# Patient Record
Sex: Female | Born: 1995 | Race: White | Hispanic: No | Marital: Single | Smoking: Never smoker
Health system: Southern US, Community
[De-identification: ages and names within clinical notes are randomized; demographics above are authoritative.]

## PROBLEM LIST (undated history)

## (undated) ENCOUNTER — Inpatient Hospital Stay (HOSPITAL_COMMUNITY): Payer: Self-pay

## (undated) DIAGNOSIS — N12 Tubulo-interstitial nephritis, not specified as acute or chronic: Secondary | ICD-10-CM

## (undated) DIAGNOSIS — N200 Calculus of kidney: Secondary | ICD-10-CM

## (undated) DIAGNOSIS — N390 Urinary tract infection, site not specified: Secondary | ICD-10-CM

## (undated) HISTORY — PX: NO PAST SURGERIES: SHX2092

---
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
## 2013-02-26 ENCOUNTER — Encounter (HOSPITAL_COMMUNITY): Payer: Self-pay | Admitting: *Deleted

## 2013-02-26 ENCOUNTER — Inpatient Hospital Stay (HOSPITAL_COMMUNITY)
Admission: AD | Admit: 2013-02-26 | Discharge: 2013-02-26 | Disposition: A | Discharge status: Home / Self Care | Payer: Medicaid Other | Source: Ambulatory Visit | Attending: Obstetrics & Gynecology | Admitting: Obstetrics & Gynecology

## 2013-02-26 DIAGNOSIS — O26893 Other specified pregnancy related conditions, third trimester: Secondary | ICD-10-CM

## 2013-02-26 DIAGNOSIS — R300 Dysuria: Secondary | ICD-10-CM | POA: Insufficient documentation

## 2013-02-26 DIAGNOSIS — N949 Unspecified condition associated with female genital organs and menstrual cycle: Secondary | ICD-10-CM | POA: Insufficient documentation

## 2013-02-26 DIAGNOSIS — N898 Other specified noninflammatory disorders of vagina: Secondary | ICD-10-CM

## 2013-02-26 DIAGNOSIS — O99891 Other specified diseases and conditions complicating pregnancy: Secondary | ICD-10-CM | POA: Insufficient documentation

## 2013-02-26 DIAGNOSIS — O26899 Other specified pregnancy related conditions, unspecified trimester: Secondary | ICD-10-CM

## 2013-02-26 HISTORY — DX: Calculus of kidney: N20.0

## 2013-02-26 HISTORY — DX: Tubulo-interstitial nephritis, not specified as acute or chronic: N12

## 2013-02-26 HISTORY — DX: Urinary tract infection, site not specified: N39.0

## 2013-02-26 LAB — URINALYSIS, ROUTINE W REFLEX MICROSCOPIC
Bilirubin Urine: NEGATIVE
Glucose, UA: NEGATIVE mg/dL
Hgb urine dipstick: NEGATIVE
Ketones, ur: NEGATIVE mg/dL
Ketones, ur: NEGATIVE mg/dL
Nitrite: NEGATIVE
Nitrite: NEGATIVE
Protein, ur: NEGATIVE mg/dL
Specific Gravity, Urine: 1.02 (ref 1.005–1.030)
Specific Gravity, Urine: 1.02 (ref 1.005–1.030)
Urobilinogen, UA: 0.2 mg/dL (ref 0.0–1.0)
pH: 7 (ref 5.0–8.0)

## 2013-02-26 LAB — WET PREP, GENITAL
Clue Cells Wet Prep HPF POC: NONE SEEN
Trich, Wet Prep: NONE SEEN
Yeast Wet Prep HPF POC: NONE SEEN

## 2013-02-26 LAB — URINE MICROSCOPIC-ADD ON

## 2013-02-26 NOTE — MAU Note (Signed)
Buring when I urinated since this morning. No other pain. I've had utis before and feels the same

## 2013-02-26 NOTE — MAU Provider Note (Signed)
Chief Complaint:  Dysuria  First Provider Initiated Contact with Patient 02/26/13 1330     HPI: Lindsay Whitney is a 17 y.o. G1P0 at [redacted]w[redacted]d who presents to maternity admissions reporting burning w/ urination since this morning that feels lie a UTI. States burning is internal and not on externmal genitalia. Also reports increased vaginal discharge w/ odor and vaginal irritation. Denies fever, chills, frequency, hematuria, urgency, flank pain, contractions, leakage of fluid or vaginal bleeding. Good fetal movement.   Past Medical History: Past Medical History  Diagnosis Date  . Kidney stones 2013  . Pyelonephritis 2013  . UTI (lower urinary tract infection)     Past obstetric history: OB History   Grav Para Term Preterm Abortions TAB SAB Ect Mult Living   1              # Outc Date GA Lbr Len/2nd Wgt Sex Del Anes PTL Lv   1 CUR               Past Surgical History: History reviewed. No pertinent past surgical history.  Family History: Family History  Problem Relation Age of Onset  . Diabetes Maternal Grandmother   . Diabetes Maternal Grandfather     Social History: History  Substance Use Topics  . Smoking status: Never Smoker   . Smokeless tobacco: Not on file  . Alcohol Use: No    Allergies: No Known Allergies  Meds:  Prescriptions prior to admission  Medication Sig Dispense Refill  . Prenatal Vit-Fe Fumarate-FA (PRENATAL MULTIVITAMIN) TABS Take 1 tablet by mouth at bedtime.        ROS: Pertinent findings in history of present illness.  Physical Exam  Blood pressure 110/60, pulse 110, temperature 98.6 F (37 C), resp. rate 20, height 5\' 1"  (1.549 m), weight 65.318 kg (144 lb), last menstrual period 07/15/2012. GENERAL: Well-developed, well-nourished female in no acute distress.  HEENT: normocephalic HEART: normal rate RESP: normal effort ABDOMEN: Soft, non-tender, gravid appropriate for gestational age BACK: No CVAT EXTREMITIES: Nontender, no edema NEURO: alert  and oriented SPECULUM EXAM: NEFG, moderate amount of creamy, white, malodorous discahrge, no blood, cervix clean Dilation: Closed Effacement (%): Thick Cervical Position: Posterior Station: Ballotable Presentation: Undeterminable Exam by:: Dorathy Kinsman, CNM  FHT:  Baseline 130 , moderate variability, accelerations present, no decelerations Contractions: none   Labs: Results for orders placed during the hospital encounter of 02/26/13 (from the past 24 hour(s))  URINALYSIS, ROUTINE W REFLEX MICROSCOPIC     Status: Abnormal   Collection Time    02/26/13 12:58 PM      Result Value Range   Color, Urine YELLOW  YELLOW   APPearance HAZY (*) CLEAR   Specific Gravity, Urine 1.020  1.005 - 1.030   pH 7.0  5.0 - 8.0   Glucose, UA NEGATIVE  NEGATIVE mg/dL   Hgb urine dipstick NEGATIVE  NEGATIVE   Bilirubin Urine NEGATIVE  NEGATIVE   Ketones, ur NEGATIVE  NEGATIVE mg/dL   Protein, ur NEGATIVE  NEGATIVE mg/dL   Urobilinogen, UA 0.2  0.0 - 1.0 mg/dL   Nitrite NEGATIVE  NEGATIVE   Leukocytes, UA LARGE (*) NEGATIVE  URINE MICROSCOPIC-ADD ON     Status: Abnormal   Collection Time    02/26/13 12:58 PM      Result Value Range   Squamous Epithelial / LPF MANY (*) RARE   WBC, UA 21-50  <3 WBC/hpf   Bacteria, UA MANY (*) RARE  WET PREP, GENITAL  Status: Abnormal   Collection Time    02/26/13  1:42 PM      Result Value Range   Yeast Wet Prep HPF POC NONE SEEN  NONE SEEN   Trich, Wet Prep NONE SEEN  NONE SEEN   Clue Cells Wet Prep HPF POC NONE SEEN  NONE SEEN   WBC, Wet Prep HPF POC MANY (*) NONE SEEN  URINALYSIS, ROUTINE W REFLEX MICROSCOPIC     Status: Abnormal   Collection Time    02/26/13  2:05 PM      Result Value Range   Color, Urine YELLOW  YELLOW   APPearance CLEAR  CLEAR   Specific Gravity, Urine 1.020  1.005 - 1.030   pH 6.5  5.0 - 8.0   Glucose, UA 250 (*) NEGATIVE mg/dL   Hgb urine dipstick NEGATIVE  NEGATIVE   Bilirubin Urine NEGATIVE  NEGATIVE   Ketones, ur NEGATIVE   NEGATIVE mg/dL   Protein, ur NEGATIVE  NEGATIVE mg/dL   Urobilinogen, UA 0.2  0.0 - 1.0 mg/dL   Nitrite NEGATIVE  NEGATIVE   Leukocytes, UA NEGATIVE  NEGATIVE    Imaging:  No results found. MAU Course: Repeat US w/ cath specimen. Culture if pos blood, leuks. * Original specimen reflexed to culture*   Assessment: 1. Dysuria in pregnancy, third trimester   2. Vaginal discharge in pregnancy, third trimester    Plan: Discharge home Preterm labor precautions and fetal kick counts. Increase fluids.     Follow-up Information   Follow up with [name], MD. (as scheduled or as needed if symptoms worsen)    Contact information:   [address] [place] [address] [phone]       Follow up with [facility] [place] [facility]ERN[facility] ADMISS[facility]. (As needed in emergencies)    Contact information:   [address] [place] [address] [phone]       Medication List    TAKE these medications       prenatal multivitamin Tabs  Take 1 tablet by mouth at bedtime.        [place], CNM 02/26/2013 3:02 PM

## 2013-02-26 NOTE — Progress Notes (Signed)
Ok to d/c efm per V. Smith CNM 

## 2013-02-26 NOTE — Progress Notes (Signed)
Ivonne Andrew CNM in and lab results reviewed. Written and verbal d/c instructions given and understanding voiced.

## 2013-02-27 LAB — GC/CHLAMYDIA PROBE AMP
CT Probe RNA: NEGATIVE
GC Probe RNA: NEGATIVE

## 2013-02-27 LAB — URINE CULTURE

## 2013-03-26 ENCOUNTER — Other Ambulatory Visit: Payer: Self-pay | Admitting: Obstetrics and Gynecology

## 2013-03-26 LAB — OB RESULTS CONSOLE GC/CHLAMYDIA
Chlamydia: NEGATIVE
Gonorrhea: NEGATIVE

## 2013-04-19 ENCOUNTER — Encounter (HOSPITAL_COMMUNITY): Payer: Self-pay | Admitting: *Deleted

## 2013-04-19 ENCOUNTER — Inpatient Hospital Stay (HOSPITAL_COMMUNITY)
Admission: AD | Admit: 2013-04-19 | Discharge: 2013-04-22 | DRG: 775 | Disposition: A | Discharge status: Home / Self Care | Payer: Medicaid Other | Source: Ambulatory Visit | Attending: Obstetrics & Gynecology | Admitting: Obstetrics & Gynecology

## 2013-04-19 ENCOUNTER — Inpatient Hospital Stay (HOSPITAL_COMMUNITY)
Admission: AD | Admit: 2013-04-19 | Discharge: 2013-04-19 | Disposition: A | Discharge status: Home / Self Care | Payer: Medicaid Other | Source: Ambulatory Visit | Attending: Obstetrics and Gynecology | Admitting: Obstetrics and Gynecology

## 2013-04-19 DIAGNOSIS — O479 False labor, unspecified: Secondary | ICD-10-CM | POA: Insufficient documentation

## 2013-04-19 LAB — CBC
MCV: 85.2 fL (ref 78.0–100.0)
Platelets: 169 10*3/uL (ref 150–400)
RBC: 3.71 MIL/uL — ABNORMAL LOW (ref 3.87–5.11)
RDW: 13.2 % (ref 11.5–15.5)
WBC: 8.3 10*3/uL (ref 4.0–10.5)

## 2013-04-19 MED ORDER — FENTANYL 2.5 MCG/ML BUPIVACAINE 1/10 % EPIDURAL INFUSION (WH - ANES)
14.0000 mL/h | INTRAMUSCULAR | Status: DC | PRN
  Administered 2013-04-20 (×2): 14 mL/h via EPIDURAL
  Filled 2013-04-19 (×2): qty 125

## 2013-04-19 MED ORDER — LACTATED RINGERS IV SOLN: 500.0000 mL | Freq: Once | INTRAVENOUS | Status: DC

## 2013-04-19 MED ORDER — OXYTOCIN BOLUS FROM INFUSION: 500.0000 mL | INTRAVENOUS | Status: DC

## 2013-04-19 MED ORDER — DIPHENHYDRAMINE HCL 50 MG/ML IJ SOLN: 12.5000 mg | INTRAMUSCULAR | Status: DC | PRN

## 2013-04-19 MED ORDER — LACTATED RINGERS IV SOLN: 500.0000 mL | INTRAVENOUS | Status: DC | PRN

## 2013-04-19 MED ORDER — EPHEDRINE 5 MG/ML INJ: 10.0000 mg | INTRAVENOUS | Status: DC | PRN

## 2013-04-19 MED ORDER — ONDANSETRON HCL 4 MG/2ML IJ SOLN
4.0000 mg | Freq: Four times a day (QID) | INTRAMUSCULAR | Status: DC | PRN
  Administered 2013-04-20: 4 mg via INTRAVENOUS
  Filled 2013-04-19: qty 2

## 2013-04-19 MED ORDER — EPHEDRINE 5 MG/ML INJ
10.0000 mg | INTRAVENOUS | Status: DC | PRN
  Filled 2013-04-19: qty 4

## 2013-04-19 MED ORDER — PHENYLEPHRINE 40 MCG/ML (10ML) SYRINGE FOR IV PUSH (FOR BLOOD PRESSURE SUPPORT): 80.0000 ug | PREFILLED_SYRINGE | INTRAVENOUS | Status: DC | PRN

## 2013-04-19 MED ORDER — OXYCODONE-ACETAMINOPHEN 5-325 MG PO TABS: 1.0000 | ORAL_TABLET | ORAL | Status: DC | PRN

## 2013-04-19 MED ORDER — LACTATED RINGERS IV SOLN
INTRAVENOUS | Status: DC
  Administered 2013-04-19 – 2013-04-20 (×4): via INTRAVENOUS

## 2013-04-19 MED ORDER — ACETAMINOPHEN 325 MG PO TABS: 650.0000 mg | ORAL_TABLET | ORAL | Status: DC | PRN

## 2013-04-19 MED ORDER — CITRIC ACID-SODIUM CITRATE 334-500 MG/5ML PO SOLN: 30.0000 mL | ORAL | Status: DC | PRN

## 2013-04-19 MED ORDER — LIDOCAINE HCL (PF) 1 % IJ SOLN
30.0000 mL | INTRAMUSCULAR | Status: DC | PRN
  Filled 2013-04-19: qty 30

## 2013-04-19 MED ORDER — IBUPROFEN 600 MG PO TABS
600.0000 mg | ORAL_TABLET | Freq: Four times a day (QID) | ORAL | Status: DC | PRN
  Administered 2013-04-20: 600 mg via ORAL
  Filled 2013-04-19: qty 1

## 2013-04-19 MED ORDER — ZOLPIDEM TARTRATE 5 MG PO TABS
5.0000 mg | ORAL_TABLET | Freq: Once | ORAL | Status: AC
  Administered 2013-04-19: 5 mg via ORAL
  Filled 2013-04-19: qty 1

## 2013-04-19 MED ORDER — BUTORPHANOL TARTRATE 1 MG/ML IJ SOLN
1.0000 mg | INTRAMUSCULAR | Status: DC | PRN
  Administered 2013-04-19 – 2013-04-20 (×3): 1 mg via INTRAVENOUS
  Filled 2013-04-19 (×3): qty 1

## 2013-04-19 MED ORDER — OXYTOCIN 40 UNITS IN LACTATED RINGERS INFUSION - SIMPLE MED
62.5000 mL/h | INTRAVENOUS | Status: DC
  Administered 2013-04-20: 62.5 mL/h via INTRAVENOUS
  Filled 2013-04-19: qty 1000

## 2013-04-19 MED ORDER — PHENYLEPHRINE 40 MCG/ML (10ML) SYRINGE FOR IV PUSH (FOR BLOOD PRESSURE SUPPORT)
80.0000 ug | PREFILLED_SYRINGE | INTRAVENOUS | Status: DC | PRN
  Filled 2013-04-19: qty 5

## 2013-04-19 NOTE — H&P (Signed)
17 y.o. G1P0  Estimated Date of Delivery: 04/21/13 admitted at 39/[redacted] weeks gestation in labor.  Prenatal Transfer Tool  Maternal Diabetes: No Genetic Screening: Normal Maternal Ultrasounds/Referrals: Normal Fetal Ultrasounds or other Referrals:  None Maternal Substance Abuse:  No Significant Maternal Medications:  None Significant Maternal Lab Results: None Other Significant Pregnancy Complications:  None  Afebrile, VSS Heart and Lungs: No active disease Abdomen: soft, gravid, EFW AGA. Cervical exam:  4.5/90  Impression: Labor  Plan:  Admit for delivery

## 2013-04-19 NOTE — MAU Note (Signed)
Pt presents with contractions since 0700 on 04/18/13 increasing in intensity at 2330. Pt denies srom, bleeding, pih symptoms.  + fm per pt.

## 2013-04-20 ENCOUNTER — Inpatient Hospital Stay (HOSPITAL_COMMUNITY): Payer: Medicaid Other | Admitting: Anesthesiology

## 2013-04-20 ENCOUNTER — Encounter (HOSPITAL_COMMUNITY): Payer: Self-pay | Admitting: Anesthesiology

## 2013-04-20 ENCOUNTER — Encounter (HOSPITAL_COMMUNITY): Payer: Self-pay | Admitting: Obstetrics

## 2013-04-20 LAB — ABO/RH: ABO/RH(D): O POS

## 2013-04-20 LAB — RPR: RPR Ser Ql: NONREACTIVE

## 2013-04-20 MED ORDER — SIMETHICONE 80 MG PO CHEW: 80.0000 mg | CHEWABLE_TABLET | ORAL | Status: DC | PRN

## 2013-04-20 MED ORDER — OXYTOCIN 40 UNITS IN LACTATED RINGERS INFUSION - SIMPLE MED
1.0000 m[IU]/min | INTRAVENOUS | Status: DC
  Administered 2013-04-20: 2 m[IU]/min via INTRAVENOUS

## 2013-04-20 MED ORDER — SENNOSIDES-DOCUSATE SODIUM 8.6-50 MG PO TABS: 2.0000 | ORAL_TABLET | Freq: Every day | ORAL | Status: DC

## 2013-04-20 MED ORDER — DIBUCAINE 1 % RE OINT: 1.0000 "application " | TOPICAL_OINTMENT | RECTAL | Status: DC | PRN

## 2013-04-20 MED ORDER — IBUPROFEN 600 MG PO TABS
600.0000 mg | ORAL_TABLET | Freq: Four times a day (QID) | ORAL | Status: DC
  Administered 2013-04-21 – 2013-04-22 (×6): 600 mg via ORAL
  Filled 2013-04-20 (×6): qty 1

## 2013-04-20 MED ORDER — ONDANSETRON HCL 4 MG PO TABS: 4.0000 mg | ORAL_TABLET | ORAL | Status: DC | PRN

## 2013-04-20 MED ORDER — DIPHENHYDRAMINE HCL 25 MG PO CAPS: 25.0000 mg | ORAL_CAPSULE | Freq: Four times a day (QID) | ORAL | Status: DC | PRN

## 2013-04-20 MED ORDER — SUCROSE 24% NICU/PEDS ORAL SOLUTION
0.5000 mL | OROMUCOSAL | Status: DC | PRN
  Filled 2013-04-20: qty 0.5

## 2013-04-20 MED ORDER — OXYCODONE-ACETAMINOPHEN 5-325 MG PO TABS
1.0000 | ORAL_TABLET | ORAL | Status: DC | PRN
  Administered 2013-04-21 – 2013-04-22 (×4): 1 via ORAL
  Filled 2013-04-20 (×4): qty 1

## 2013-04-20 MED ORDER — ZOLPIDEM TARTRATE 5 MG PO TABS: 5.0000 mg | ORAL_TABLET | Freq: Every evening | ORAL | Status: DC | PRN

## 2013-04-20 MED ORDER — VITAMIN K1 1 MG/0.5ML IJ SOLN: 1.0000 mg | Freq: Once | INTRAMUSCULAR | Status: DC

## 2013-04-20 MED ORDER — PRENATAL MULTIVITAMIN CH
1.0000 | ORAL_TABLET | Freq: Every day | ORAL | Status: DC
  Administered 2013-04-21: 1 via ORAL
  Filled 2013-04-20: qty 1

## 2013-04-20 MED ORDER — ONDANSETRON HCL 4 MG/2ML IJ SOLN
4.0000 mg | INTRAMUSCULAR | Status: DC | PRN
Start: 2013-04-20 — End: 2013-04-22

## 2013-04-20 MED ORDER — WITCH HAZEL-GLYCERIN EX PADS: 1.0000 "application " | MEDICATED_PAD | CUTANEOUS | Status: DC | PRN

## 2013-04-20 MED ORDER — BENZOCAINE-MENTHOL 20-0.5 % EX AERO: 1.0000 "application " | INHALATION_SPRAY | CUTANEOUS | Status: DC | PRN

## 2013-04-20 MED ORDER — ERYTHROMYCIN 5 MG/GM OP OINT: 1.0000 "application " | TOPICAL_OINTMENT | Freq: Once | OPHTHALMIC | Status: DC

## 2013-04-20 MED ORDER — TETANUS-DIPHTH-ACELL PERTUSSIS 5-2.5-18.5 LF-MCG/0.5 IM SUSP: 0.5000 mL | Freq: Once | INTRAMUSCULAR | Status: DC

## 2013-04-20 MED ORDER — HEPATITIS B VAC RECOMBINANT 10 MCG/0.5ML IJ SUSP: 0.5000 mL | Freq: Once | INTRAMUSCULAR | Status: DC

## 2013-04-20 MED ORDER — SODIUM BICARBONATE 8.4 % IV SOLN
INTRAVENOUS | Status: DC | PRN
  Administered 2013-04-20: 5 mL via EPIDURAL

## 2013-04-20 MED ORDER — LANOLIN HYDROUS EX OINT: TOPICAL_OINTMENT | CUTANEOUS | Status: DC | PRN

## 2013-04-20 MED ORDER — TERBUTALINE SULFATE 1 MG/ML IJ SOLN: 0.2500 mg | Freq: Once | INTRAMUSCULAR | Status: DC | PRN

## 2013-04-20 NOTE — Anesthesia Procedure Notes (Signed)

## 2013-04-20 NOTE — Anesthesia Preprocedure Evaluation (Signed)

## 2013-04-20 NOTE — Progress Notes (Signed)
Patient with regular contractions.  Cervix still 4.5/80.  AROM clear fluid.  Will start IV pitocin augmentation.

## 2013-04-21 LAB — CBC
HCT: 26.3 % — ABNORMAL LOW (ref 36.0–46.0)
Hemoglobin: 8.9 g/dL — ABNORMAL LOW (ref 12.0–15.0)
MCH: 28.8 pg (ref 26.0–34.0)
MCHC: 33.8 g/dL (ref 30.0–36.0)
MCV: 85.1 fL (ref 78.0–100.0)
Platelets: 133 K/uL — ABNORMAL LOW (ref 150–400)
RBC: 3.09 MIL/uL — ABNORMAL LOW (ref 3.87–5.11)
RDW: 13.3 % (ref 11.5–15.5)
WBC: 10.9 K/uL — ABNORMAL HIGH (ref 4.0–10.5)

## 2013-04-21 NOTE — Anesthesia Postprocedure Evaluation (Signed)
  Anesthesia Post-op Note  Patient: Lindsay Whitney  Procedure(s) Performed: * No procedures listed *  Patient Location: Mother/Baby  Anesthesia Type:Epidural  Level of Consciousness: awake, alert  and oriented  Airway and Oxygen Therapy: Patient Spontanous Breathing  Post-op Pain: mild  Post-op Assessment: Post-op Vital signs reviewed, Patient's Cardiovascular Status Stable, Respiratory Function Stable, Patent Airway, No signs of Nausea or vomiting, Adequate PO intake, Pain level controlled, No headache, No backache, No residual numbness and No residual motor weakness  Post-op Vital Signs: Reviewed and stable  Complications: No apparent anesthesia complications

## 2013-04-21 NOTE — Progress Notes (Signed)
Post Partum Day 1 Subjective: no complaints  Objective: Blood pressure 101/63, pulse 102, temperature 97.8 F (36.6 C), temperature source Oral, resp. rate 18, height 5\' 1"  (1.549 m), weight 71.668 kg (158 lb), last menstrual period 07/15/2012, SpO2 97.00%, not breastfeeding.  Physical Exam:  General: alert Lochia: appropriate Uterine Fundus: firm   Recent Labs  04/19/13 2220 04/21/13 0605  HGB 10.6* 8.9*  HCT 31.6* 26.3*    Assessment/Plan: Plan for discharge tomorrow   LOS: 2 days   Loneta Tamplin D 04/21/2013, 9:31 AM

## 2013-04-22 NOTE — Discharge Summary (Signed)
Obstetric Discharge Summary Reason for Admission: onset of labor Prenatal Procedures: none Intrapartum Procedures: spontaneous vaginal delivery Postpartum Procedures: none Complications-Operative and Postpartum: 2nd degree perineal Hemoglobin  Date Value Range Status  04/21/2013 8.9* 12.0 - 15.0 g/dL Final     HCT  Date Value Range Status  04/21/2013 26.3* 36.0 - 46.0 % Final    Physical Exam:  General: alert and cooperative Lochia: appropriate Uterine Fundus: firm DVT Evaluation: No evidence of DVT seen on physical exam.  Discharge Diagnoses: Term Pregnancy-delivered  Discharge Information: Date: 04/22/2013 Activity: pelvic rest Diet: routine Medications: PNV and Ibuprofen Condition: stable Instructions: refer to practice specific booklet Discharge to: home Follow-up Information   Follow up with [name], MD In 4 weeks.   Contact information:   [address] [place] [address] [phone]       Newborn Data: Live born female  Birth Weight: 6 lb 7.2 oz (2925 g) APGAR: 9, 9  Home with mother.  Philip Aspen 04/22/2013, 9:45 AM

## 2013-04-22 NOTE — Progress Notes (Signed)
UR chart review completed.  

## 2013-11-07 NOTE — L&D Delivery Note (Addendum)
  Vaginal Delivery Note  The pt utilized an epidural as pain management.   Artificial rupture of membranes today, at 0515, clear.  GBS was positive, AMP x 2 doses was given.  Cervical dilation was complete at 0711.  NICHD Category 1.    Pushing with guidance began at  0733.  After 22 minutes of pushing the head, shoulders and the body of a viable female infant (Ace) delivered spontaneously with maternal effort in the ROA position at 0755  With vigorous tone and spontaneous cry, the infant was placed on moms abd.   The cord was clamped then cut by the FOB.  Spontaneous delivery of a intact placenta with a 3 vessel cord via Shultz at [phone].  Episiotomy: None The vulva, perineum, vaginal vault, rectum and cervix were inspected, 2 degree perineal laceration which was repair using a 3-0 vicryl on a CT needle with 5cc of 1% lidocaine.  Postpartum pitocin as ordered.   Fundus firm, lochia moderate, bleeding under control.  EBL 200, Pt hemodynamically stable.  Sponge, laps and needle count correct and verified with the primary care nurse.  Attending MD available at all times.    Mom and baby were left in stable condition, baby skin to skin.  Routine postpartum orders  Mother desires Nexplanon for contraception  Infant to have out patient circumcision  Placenta to pathology: NO Cord blood sent to lab: YES Cord Gases sent to lab: NO   APGARS:  9 at 1 minute and 9 at 5 minutes Weight:.  7lbs 13oz (3550g)   Lindsay Whitney, CNM, MSN 07/10/2014. 9:00 AM

## 2013-12-02 ENCOUNTER — Inpatient Hospital Stay (HOSPITAL_COMMUNITY): Payer: Medicaid Other

## 2013-12-02 ENCOUNTER — Encounter (HOSPITAL_COMMUNITY): Payer: Self-pay | Admitting: *Deleted

## 2013-12-02 ENCOUNTER — Inpatient Hospital Stay (HOSPITAL_COMMUNITY)
Admission: AD | Admit: 2013-12-02 | Discharge: 2013-12-02 | Disposition: A | Discharge status: Home / Self Care | Payer: Medicaid Other | Source: Ambulatory Visit | Attending: Obstetrics & Gynecology | Admitting: Obstetrics & Gynecology

## 2013-12-02 DIAGNOSIS — M549 Dorsalgia, unspecified: Secondary | ICD-10-CM | POA: Insufficient documentation

## 2013-12-02 DIAGNOSIS — O209 Hemorrhage in early pregnancy, unspecified: Secondary | ICD-10-CM

## 2013-12-02 DIAGNOSIS — O208 Other hemorrhage in early pregnancy: Secondary | ICD-10-CM | POA: Insufficient documentation

## 2013-12-02 LAB — OB RESULTS CONSOLE GC/CHLAMYDIA
Chlamydia: NEGATIVE
GC PROBE AMP, GENITAL: NEGATIVE

## 2013-12-02 LAB — CBC
HCT: 34.6 % — ABNORMAL LOW (ref 36.0–46.0)
Hemoglobin: 12.4 g/dL (ref 12.0–15.0)
MCH: 28.3 pg (ref 26.0–34.0)
MCHC: 35.8 g/dL (ref 30.0–36.0)
MCV: 79 fL (ref 78.0–100.0)
PLATELETS: 229 10*3/uL (ref 150–400)
RBC: 4.38 MIL/uL (ref 3.87–5.11)
RDW: 13.7 % (ref 11.5–15.5)
WBC: 6.6 10*3/uL (ref 4.0–10.5)

## 2013-12-02 LAB — URINALYSIS, ROUTINE W REFLEX MICROSCOPIC
BILIRUBIN URINE: NEGATIVE
GLUCOSE, UA: NEGATIVE mg/dL
HGB URINE DIPSTICK: NEGATIVE
Ketones, ur: NEGATIVE mg/dL
Leukocytes, UA: NEGATIVE
Nitrite: NEGATIVE
PH: 6 (ref 5.0–8.0)
Protein, ur: NEGATIVE mg/dL
SPECIFIC GRAVITY, URINE: 1.01 (ref 1.005–1.030)
Urobilinogen, UA: 0.2 mg/dL (ref 0.0–1.0)

## 2013-12-02 LAB — WET PREP, GENITAL
Clue Cells Wet Prep HPF POC: NONE SEEN
Trich, Wet Prep: NONE SEEN
Yeast Wet Prep HPF POC: NONE SEEN

## 2013-12-02 LAB — HCG, QUANTITATIVE, PREGNANCY: HCG, BETA CHAIN, QUANT, S: 127481 m[IU]/mL — AB (ref <5)

## 2013-12-02 LAB — ABO/RH: ABO/RH(D): O POS

## 2013-12-02 LAB — POCT PREGNANCY, URINE: PREG TEST UR: POSITIVE — AB

## 2013-12-02 NOTE — Discharge Instructions (Signed)
Threatened Miscarriage  Bleeding during the first 20 weeks of pregnancy is common. This is sometimes called a threatened miscarriage. This is a pregnancy that is threatening to end before the twentieth week of pregnancy. Often this bleeding stops with bed rest or decreased activities as suggested by your caregiver and the pregnancy continues without any more problems. You may be asked to not have sexual intercourse, have orgasms or use tampons until further notice. Sometimes a threatened miscarriage can progress to a complete or incomplete miscarriage. This may or may not require further treatment. Some miscarriages occur before a woman misses a menstrual period and knows she is pregnant.  Miscarriages occur in 15 to 20% of all pregnancies and usually occur during the first 13 weeks of the pregnancy. The exact cause of a miscarriage is usually never known. A miscarriage is natures way of ending a pregnancy that is abnormal or would not make it to term. There are some things that may put you at risk to have a miscarriage, such as:  · Hormone problems.  · Infection of the uterus or cervix.  · Chronic illness, diabetes for example, especially if it is not controlled.  · Abnormal shaped uterus.  · Fibroids in the uterus.  · Incompetent cervix (the cervix is too weak to hold the baby).  · Smoking.  · Drinking too much alcohol. It's best not to drink any alcohol when you are pregnant.  · Taking illegal drugs.  TREATMENT   When a miscarriage becomes complete and all products of conception (all the tissue in the uterus) have been passed, often no treatment is needed. If you think you passed tissue, save it in a container and take it to your doctor for evaluation. If the miscarriage is incomplete (parts of the fetus or placenta remain in the uterus), further treatment may be needed. The most common reason for further treatment is continued bleeding (hemorrhage) because pregnancy tissue did not pass out of the uterus. This  often occurs if a miscarriage is incomplete. Tissue left behind may also become infected. Treatment usually is dilatation and curettage (the removal of the remaining products of pregnancy. This can be done by a simple sucking procedure (suction curettage) or a simple scraping of the inside of the uterus. This may be done in the hospital or in the caregiver's office. This is only done when your caregiver knows that there is no chance for the pregnancy to proceed to term. This is determined by physical examination, negative pregnancy test, falling pregnancy hormone count and/or, an ultrasound revealing a dead fetus.  Miscarriages are often a very emotional time for prospective mothers and fathers. This is not you or your partners fault. It did not occur because of an inadequacy in you or your partner. Nearly all miscarriages occur because the pregnancy has started off wrongly. At least half of these pregnancies have a chromosomal abnormality. It is almost always not inherited. Others may have developmental problems with the fetus or placenta. This does not always show up even when the products miscarried are studied under the microscope. The miscarriage is nearly always not your fault and it is not likely that you could have prevented it from happening. If you are having emotional and grieving problems, talk to your health care provider and even seek counseling, if necessary, before getting pregnant again. You can begin trying for another pregnancy as soon as your caregiver says it is OK.  HOME CARE INSTRUCTIONS   · Your caregiver may order   bed rest depending on how much bleeding and cramping you are having. You may be limited to only getting up to go to the bathroom. You may be allowed to continue light activity. You may need to make arrangements for the care of your other children and for any other responsibilities.  · Keep track of the number of pads you use each day, how often you have to change pads and how  saturated (soaked) they are. Record this information.  · DO NOT USE TAMPONS. Do not douche, have sexual intercourse or orgasms until approved by your caregiver.  · You may receive a follow up appointment for re-evaluation of your pregnancy and a repeat blood test. Re-evaluation often occurs after 2 days and again in 4 to 6 weeks. It is very important that you follow-up in the recommended time period.  · If you are Rh negative and the father is Rh positive or you do not know the fathers' blood type, you may receive a shot (Rh immune globulin) to help prevent abnormal antibodies that can develop and affect the baby in any future pregnancies.  SEEK IMMEDIATE MEDICAL CARE IF:  · You have severe cramps in your stomach, back, or abdomen.  · You have a sudden onset of severe pain in the lower part of your abdomen.  · You develop chills.  · You run an unexplained temperature of 101° F (38.3° C) or higher.  · You pass large clots or tissue. Save any tissue for your caregiver to inspect.  · Your bleeding increases or you become light-headed, weak, or have fainting episodes.  · You have a gush of fluid from your vagina.  · You pass out. This could mean you have a tubal (ectopic) pregnancy.  Document Released: 10/24/2005 Document Revised: 01/16/2012 Document Reviewed: 06/09/2008  [employer]® Patient Information ©2014 [employer].

## 2013-12-02 NOTE — MAU Provider Note (Signed)
History   Pt is a 18 yo G2P1001 at [redacted]w[redacted]d complaining of vaginal bleeding in pregnancy.  CSN: [id number]  Arrival date and time: 12/02/13 1342   First Provider Initiated Contact with Patient 12/02/13 1429      Chief Complaint  Patient presents with  . Vaginal Bleeding   Vaginal Bleeding This is a new problem. Associated symptoms include back pain. Pertinent negatives include no abdominal pain.    [name] is a 18 yo G2P1001 at [redacted]w[redacted]d gestation.  She reports brownish vaginal bleeding with dime-sized clots starting 2 days ago and lasting one day.  The blood was seen in the toilet and when the patient wiped but was not flowing enough for a pad. The bleeding has since subsided but she still has constant lower back pain.  The patient also has a 17 mos old at home, so this is a closely spaced pregnancy. She denies abdominal pain.  Past Medical History  Diagnosis Date  . Kidney stones 2013  . Pyelonephritis 2013  . UTI (lower urinary tract infection)     Past Surgical History  Procedure Laterality Date  . No past surgeries      Family History  Problem Relation Age of Onset  . Diabetes Maternal Grandmother   . Diabetes Maternal Grandfather     History  Substance Use Topics  . Smoking status: Never Smoker   . Smokeless tobacco: Never Used  . Alcohol Use: No    Allergies: No Known Allergies  Prescriptions prior to admission  Medication Sig Dispense Refill  . Prenatal Vit-Fe Fumarate-FA (PRENATAL MULTIVITAMIN) TABS Take 1 tablet by mouth daily at 12 noon.        Review of Systems  Constitutional: Negative.   HENT: Negative.   Eyes: Negative.   Respiratory: Negative.   Cardiovascular: Negative.   Gastrointestinal: Negative for abdominal pain.  Genitourinary: Negative.        + Vaginal bleeding  Musculoskeletal: Positive for back pain.  Skin: Negative.   Neurological: Negative.   Endo/Heme/Allergies: Negative.   Psychiatric/Behavioral: Negative.    Physical Exam    Height 5\' 1"  (1.549 m), weight 58.06 kg (128 lb), last menstrual period 10/09/2013, not currently breastfeeding.  Physical Exam  Constitutional: She is oriented to person, place, and time. She appears well-developed and well-nourished.  HENT:  Head: Normocephalic and atraumatic.  Eyes: Conjunctivae are normal.  Neck: Normal range of motion. Neck supple.  Cardiovascular: Normal rate and regular rhythm.   Respiratory: Effort normal.  GI: Soft. Bowel sounds are normal.  Genitourinary: Vaginal discharge found.  Leukorrhea, No bleeding observed Uterine size appropriate for gestation  Musculoskeletal: She exhibits tenderness.  Lower back  Neurological: She is alert and oriented to person, place, and time.  Skin: Skin is warm and dry.  Psychiatric: She has a normal mood and affect. Her behavior is normal.    MAU Course  Procedures  MDM Wet Prep>> Negative US Ob Comp Less 14 Wks  12/02/2013   CLINICAL DATA:  Early pregnancy, bleeding  EXAM: OBSTETRIC <14 WK ULTRASOUND  TECHNIQUE: Transabdominal ultrasound was performed for evaluation of the gestation as well as the maternal uterus and adnexal regions.  COMPARISON:  None.  FINDINGS: Intrauterine gestational sac: Visualized/normal in shape.  Yolk sac:  Present  Embryo:  Present  Cardiac Activity: Present  Heart Rate: 179 bpm  CRL:   17.3  mm   8 w 2 d  US EDC: 07/12/2014  Maternal uterus/adnexae: Small subchorionic hemorrhage.  Bilateral ovaries are unremarkable. Suspected left corpus luteal cyst.  No free fluid.  IMPRESSION: Single live intrauterine gestation with estimated gestational age [redacted] weeks 2 days by crown-rump length.   Electronically Signed   By: Charline BillsSriyesh  Krishnan M.D.   On: 12/02/2013 16:12   Assessment and Plan  A: IUP at 6012w5d Small subchorionic hemorrhage   P: Discharge to home with pelvic rest Advise patient to schedule initial prenatal visit with provider of choice  Selena LesserBraimah, Tina 12/02/2013, 2:39 PM    Results for orders placed during the hospital encounter of 12/02/13 (from the past 24 hour(s))  URINALYSIS, ROUTINE W REFLEX MICROSCOPIC     Status: Abnormal   Collection Time    12/02/13  1:53 PM      Result Value Range   Color, Urine STRAW (*) YELLOW   APPearance CLEAR  CLEAR   Specific Gravity, Urine 1.010  1.005 - 1.030   pH 6.0  5.0 - 8.0   Glucose, UA NEGATIVE  NEGATIVE mg/dL   Hgb urine dipstick NEGATIVE  NEGATIVE   Bilirubin Urine NEGATIVE  NEGATIVE   Ketones, ur NEGATIVE  NEGATIVE mg/dL   Protein, ur NEGATIVE  NEGATIVE mg/dL   Urobilinogen, UA 0.2  0.0 - 1.0 mg/dL   Nitrite NEGATIVE  NEGATIVE   Leukocytes, UA NEGATIVE  NEGATIVE  POCT PREGNANCY, URINE     Status: Abnormal   Collection Time    12/02/13  1:56 PM      Result Value Range   Preg Test, Ur POSITIVE (*) NEGATIVE  CBC     Status: Abnormal   Collection Time    12/02/13  2:04 PM      Result Value Range   WBC 6.6  4.0 - 10.5 K/uL   RBC 4.38  3.87 - 5.11 MIL/uL   Hemoglobin 12.4  12.0 - 15.0 g/dL   HCT 46.934.6 (*) 62.936.0 - 52.846.0 %   MCV 79.0  78.0 - 100.0 fL   MCH 28.3  26.0 - 34.0 pg   MCHC 35.8  30.0 - 36.0 g/dL   RDW 41.313.7  24.411.5 - 01.015.5 %   Platelets 229  150 - 400 K/uL  ABO/RH     Status: None   Collection Time    12/02/13  2:04 PM      Result Value Range   ABO/RH(D) O POS    HCG, QUANTITATIVE, PREGNANCY     Status: Abnormal   Collection Time    12/02/13  2:04 PM      Result Value Range   hCG, Beta Chain, [place] LongestQuant, S [id number] (*) <5 mIU/mL  WET PREP, GENITAL     Status: Abnormal   Collection Time    12/02/13  3:03 PM      Result Value Range   Yeast Wet Prep HPF POC NONE SEEN  NONE SEEN   Trich, Wet Prep NONE SEEN  NONE SEEN   Clue Cells Wet Prep HPF POC NONE SEEN  NONE SEEN   WBC, Wet Prep HPF POC FEW (*) NONE SEEN   Seen also by me Agree with note:  Discussed findings with patient.   Will discharge home with bleeding precautions and pelvic rest Plans care with [facility].  Aviva SignsMarie L Yareth Kearse, CNM

## 2013-12-02 NOTE — MAU Note (Signed)
+   HPT. Bleeding Saturday night and into Sunday AM. No longer bleeding. C/O constant lower back pain.

## 2013-12-03 LAB — GC/CHLAMYDIA PROBE AMP
CT Probe RNA: NEGATIVE
GC Probe RNA: NEGATIVE

## 2014-01-27 LAB — OB RESULTS CONSOLE RPR: RPR: NONREACTIVE

## 2014-01-27 LAB — OB RESULTS CONSOLE HIV ANTIBODY (ROUTINE TESTING): HIV: NONREACTIVE

## 2014-01-27 LAB — OB RESULTS CONSOLE ABO/RH: RH TYPE: POSITIVE

## 2014-01-27 LAB — OB RESULTS CONSOLE RUBELLA ANTIBODY, IGM: RUBELLA: IMMUNE

## 2014-01-27 LAB — OB RESULTS CONSOLE ANTIBODY SCREEN: ANTIBODY SCREEN: NEGATIVE

## 2014-01-27 LAB — OB RESULTS CONSOLE HEPATITIS B SURFACE ANTIGEN: Hepatitis B Surface Ag: NEGATIVE

## 2014-06-16 LAB — OB RESULTS CONSOLE GBS: GBS: POSITIVE

## 2014-07-09 ENCOUNTER — Encounter (HOSPITAL_COMMUNITY): Payer: Self-pay | Admitting: *Deleted

## 2014-07-09 ENCOUNTER — Inpatient Hospital Stay (HOSPITAL_COMMUNITY)
Admission: AD | Admit: 2014-07-09 | Discharge: 2014-07-12 | DRG: 775 | Disposition: A | Discharge status: Home / Self Care | Payer: Medicaid Other | Source: Ambulatory Visit | Attending: Obstetrics & Gynecology | Admitting: Obstetrics & Gynecology

## 2014-07-09 DIAGNOSIS — O9989 Other specified diseases and conditions complicating pregnancy, childbirth and the puerperium: Secondary | ICD-10-CM

## 2014-07-09 DIAGNOSIS — Z87442 Personal history of urinary calculi: Secondary | ICD-10-CM

## 2014-07-09 DIAGNOSIS — O99119 Other diseases of the blood and blood-forming organs and certain disorders involving the immune mechanism complicating pregnancy, unspecified trimester: Secondary | ICD-10-CM

## 2014-07-09 DIAGNOSIS — O9912 Other diseases of the blood and blood-forming organs and certain disorders involving the immune mechanism complicating childbirth: Secondary | ICD-10-CM

## 2014-07-09 DIAGNOSIS — D649 Anemia, unspecified: Secondary | ICD-10-CM | POA: Diagnosis present

## 2014-07-09 DIAGNOSIS — Z833 Family history of diabetes mellitus: Secondary | ICD-10-CM

## 2014-07-09 DIAGNOSIS — O9902 Anemia complicating childbirth: Secondary | ICD-10-CM | POA: Diagnosis present

## 2014-07-09 DIAGNOSIS — D689 Coagulation defect, unspecified: Secondary | ICD-10-CM | POA: Diagnosis present

## 2014-07-09 DIAGNOSIS — O479 False labor, unspecified: Secondary | ICD-10-CM | POA: Insufficient documentation

## 2014-07-09 DIAGNOSIS — Z22330 Carrier of Group B streptococcus: Secondary | ICD-10-CM

## 2014-07-09 DIAGNOSIS — O99892 Other specified diseases and conditions complicating childbirth: Secondary | ICD-10-CM | POA: Diagnosis present

## 2014-07-09 DIAGNOSIS — Z62810 Personal history of physical and sexual abuse in childhood: Secondary | ICD-10-CM

## 2014-07-09 DIAGNOSIS — D696 Thrombocytopenia, unspecified: Secondary | ICD-10-CM | POA: Diagnosis present

## 2014-07-09 NOTE — MAU Provider Note (Signed)
History  18 yo G2P1001 @ 39.4 presents to MAU unannounced w/ c/o worsening ctxs since around lunch time. States was seen in the office this morning and had membranes swept. Reports active fetus. Denies VB or LOF.  Patient Active Problem List   Diagnosis Date Noted  . Irregular uterine contractions 07/10/2014    Chief Complaint  Patient presents with  . Labor Eval   HPI See above OB History   Grav Para Term Preterm Abortions TAB SAB Ect Mult Living   Past Medical History  Diagnosis Date  . Kidney stones 2013  . Pyelonephritis 2013  . UTI (lower urinary tract infection)     Past Surgical History  Procedure Laterality Date  . No past surgeries      Family History  Problem Relation Age of Onset  . Diabetes Maternal Grandmother   . Diabetes Maternal Grandfather     History  Substance Use Topics  . Smoking status: Never Smoker   . Smokeless tobacco: Never Used  . Alcohol Use: No    Allergies: No Known Allergies  Prescriptions prior to admission  Medication Sig Dispense Refill  . Prenatal Vit-Fe Fumarate-FA (PRENATAL MULTIVITAMIN) TABS Take 1 tablet by mouth daily at 12 noon.        ROS +ctxs Physical Exam   Blood pressure 113/68, pulse 104, temperature 98.7 F (37.1 C), temperature source Oral, resp. rate 18, height  (1.549 m), weight 151 lb (68.493 kg), last menstrual period 10/09/2013, SpO2 97.00%, not currently breastfeeding.  Physical Exam Gen: NAD Pelvic: 3/50/-3 FHRT: BL FHR 130 w/ mod variability, +accels, no decels U/C's: q 1-4 min, palpate mild ED Course  Assessment: Latent labor Cat 1 FHRT  Plan: Discussed ambulating x 1-2 hrs; pt in agreement.   Sherre Scarlet CNM, MS 07/10/2014 12:33 AM  ADDENDUM:  Pt returned from walking. Cervix now 5/70/-2. Will admit.  Sherre Scarlet, CNM 07/10/14 @ 12:46 AM

## 2014-07-09 NOTE — MAU Note (Signed)
Pt reports contractions, denies bleeding or ROM.  

## 2014-07-09 NOTE — MAU Note (Signed)
Pt. Walking around unit until 0000 as per Thornell Mule CNM. Pt told to come back if bleeding or leaking of fluid, stronger contractions, any concerns.

## 2014-07-10 ENCOUNTER — Encounter (HOSPITAL_COMMUNITY): Payer: Medicaid Other | Admitting: Anesthesiology

## 2014-07-10 ENCOUNTER — Encounter (HOSPITAL_COMMUNITY): Payer: Self-pay

## 2014-07-10 ENCOUNTER — Inpatient Hospital Stay (HOSPITAL_COMMUNITY): Payer: Medicaid Other | Admitting: Anesthesiology

## 2014-07-10 DIAGNOSIS — D649 Anemia, unspecified: Secondary | ICD-10-CM | POA: Diagnosis present

## 2014-07-10 DIAGNOSIS — O99119 Other diseases of the blood and blood-forming organs and certain disorders involving the immune mechanism complicating pregnancy, unspecified trimester: Secondary | ICD-10-CM | POA: Diagnosis present

## 2014-07-10 DIAGNOSIS — Z87442 Personal history of urinary calculi: Secondary | ICD-10-CM | POA: Diagnosis not present

## 2014-07-10 DIAGNOSIS — D689 Coagulation defect, unspecified: Secondary | ICD-10-CM | POA: Diagnosis present

## 2014-07-10 DIAGNOSIS — O9902 Anemia complicating childbirth: Secondary | ICD-10-CM | POA: Diagnosis present

## 2014-07-10 DIAGNOSIS — D696 Thrombocytopenia, unspecified: Secondary | ICD-10-CM

## 2014-07-10 DIAGNOSIS — Z833 Family history of diabetes mellitus: Secondary | ICD-10-CM | POA: Diagnosis not present

## 2014-07-10 DIAGNOSIS — Z22330 Carrier of Group B streptococcus: Secondary | ICD-10-CM | POA: Diagnosis not present

## 2014-07-10 DIAGNOSIS — Z62810 Personal history of physical and sexual abuse in childhood: Secondary | ICD-10-CM

## 2014-07-10 DIAGNOSIS — O99892 Other specified diseases and conditions complicating childbirth: Secondary | ICD-10-CM | POA: Diagnosis present

## 2014-07-10 DIAGNOSIS — O479 False labor, unspecified: Secondary | ICD-10-CM | POA: Diagnosis present

## 2014-07-10 HISTORY — DX: Other diseases of the blood and blood-forming organs and certain disorders involving the immune mechanism complicating pregnancy, unspecified trimester: O99.119

## 2014-07-10 HISTORY — DX: Thrombocytopenia, unspecified: D69.6

## 2014-07-10 LAB — RPR

## 2014-07-10 LAB — CBC
HCT: 32.9 % — ABNORMAL LOW (ref 36.0–46.0)
Hemoglobin: 11.6 g/dL — ABNORMAL LOW (ref 12.0–15.0)
MCH: 29.5 pg (ref 26.0–34.0)
MCHC: 35.3 g/dL (ref 30.0–36.0)
MCV: 83.7 fL (ref 78.0–100.0)
PLATELETS: 135 10*3/uL — AB (ref 150–400)
RBC: 3.93 MIL/uL (ref 3.87–5.11)
RDW: 14.4 % (ref 11.5–15.5)
WBC: 9 10*3/uL (ref 4.0–10.5)

## 2014-07-10 LAB — TYPE AND SCREEN
ABO/RH(D): O POS
ANTIBODY SCREEN: NEGATIVE

## 2014-07-10 LAB — SAMPLE TO BLOOD BANK

## 2014-07-10 MED ORDER — CITRIC ACID-SODIUM CITRATE 334-500 MG/5ML PO SOLN
30.0000 mL | ORAL | Status: DC | PRN
Start: 2014-07-10 — End: 2014-07-10

## 2014-07-10 MED ORDER — LIDOCAINE HCL (PF) 1 % IJ SOLN
30.0000 mL | INTRAMUSCULAR | Status: AC | PRN
  Administered 2014-07-10: 30 mL via SUBCUTANEOUS
  Filled 2014-07-10: qty 30

## 2014-07-10 MED ORDER — ONDANSETRON HCL 4 MG/2ML IJ SOLN: 4.0000 mg | INTRAMUSCULAR | Status: DC | PRN

## 2014-07-10 MED ORDER — OXYCODONE-ACETAMINOPHEN 5-325 MG PO TABS
1.0000 | ORAL_TABLET | ORAL | Status: DC | PRN
  Administered 2014-07-10 – 2014-07-11 (×5): 1 via ORAL
  Filled 2014-07-10 (×5): qty 1

## 2014-07-10 MED ORDER — DIBUCAINE 1 % RE OINT: 1.0000 "application " | TOPICAL_OINTMENT | RECTAL | Status: DC | PRN

## 2014-07-10 MED ORDER — AMPICILLIN SODIUM 1 G IJ SOLR
1.0000 g | INTRAMUSCULAR | Status: DC
Start: 2014-07-10 — End: 2014-07-10
  Administered 2014-07-10: 1 g via INTRAVENOUS
  Filled 2014-07-10 (×5): qty 1000

## 2014-07-10 MED ORDER — PRENATAL MULTIVITAMIN CH
1.0000 | ORAL_TABLET | Freq: Every day | ORAL | Status: DC
  Administered 2014-07-10 – 2014-07-11 (×2): 1 via ORAL
  Filled 2014-07-10 (×2): qty 1

## 2014-07-10 MED ORDER — OXYTOCIN 40 UNITS IN LACTATED RINGERS INFUSION - SIMPLE MED
62.5000 mL/h | INTRAVENOUS | Status: DC
  Filled 2014-07-10: qty 1000

## 2014-07-10 MED ORDER — ONDANSETRON HCL 4 MG PO TABS: 4.0000 mg | ORAL_TABLET | ORAL | Status: DC | PRN

## 2014-07-10 MED ORDER — LANOLIN HYDROUS EX OINT: TOPICAL_OINTMENT | CUTANEOUS | Status: DC | PRN

## 2014-07-10 MED ORDER — OXYTOCIN BOLUS FROM INFUSION
500.0000 mL | INTRAVENOUS | Status: DC
  Administered 2014-07-10: 500 mL via INTRAVENOUS

## 2014-07-10 MED ORDER — LACTATED RINGERS IV SOLN
500.0000 mL | Freq: Once | INTRAVENOUS | Status: AC
  Administered 2014-07-10: 500 mL via INTRAVENOUS

## 2014-07-10 MED ORDER — IBUPROFEN 600 MG PO TABS
600.0000 mg | ORAL_TABLET | Freq: Once | ORAL | Status: AC
  Administered 2014-07-10: 600 mg via ORAL
  Filled 2014-07-10: qty 1

## 2014-07-10 MED ORDER — NALBUPHINE HCL 10 MG/ML IJ SOLN: 5.0000 mg | INTRAMUSCULAR | Status: DC | PRN

## 2014-07-10 MED ORDER — SODIUM CHLORIDE 0.9 % IV SOLN
1.0000 g | INTRAVENOUS | Status: DC
  Administered 2014-07-10: 1 g via INTRAVENOUS
  Filled 2014-07-10 (×4): qty 1000

## 2014-07-10 MED ORDER — OXYCODONE-ACETAMINOPHEN 5-325 MG PO TABS: 2.0000 | ORAL_TABLET | ORAL | Status: DC | PRN

## 2014-07-10 MED ORDER — LIDOCAINE HCL (PF) 1 % IJ SOLN
INTRAMUSCULAR | Status: DC | PRN
  Administered 2014-07-10 (×2): 4 mL

## 2014-07-10 MED ORDER — PHENYLEPHRINE 40 MCG/ML (10ML) SYRINGE FOR IV PUSH (FOR BLOOD PRESSURE SUPPORT)
80.0000 ug | PREFILLED_SYRINGE | INTRAVENOUS | Status: DC | PRN
  Filled 2014-07-10: qty 10
  Filled 2014-07-10: qty 2

## 2014-07-10 MED ORDER — LACTATED RINGERS IV SOLN
INTRAVENOUS | Status: DC
  Administered 2014-07-10 (×2): via INTRAVENOUS

## 2014-07-10 MED ORDER — DIPHENHYDRAMINE HCL 50 MG/ML IJ SOLN: 12.5000 mg | INTRAMUSCULAR | Status: DC | PRN

## 2014-07-10 MED ORDER — SIMETHICONE 80 MG PO CHEW: 80.0000 mg | CHEWABLE_TABLET | ORAL | Status: DC | PRN

## 2014-07-10 MED ORDER — EPHEDRINE 5 MG/ML INJ
10.0000 mg | INTRAVENOUS | Status: DC | PRN
  Filled 2014-07-10: qty 2

## 2014-07-10 MED ORDER — FENTANYL 2.5 MCG/ML BUPIVACAINE 1/10 % EPIDURAL INFUSION (WH - ANES)
INTRAMUSCULAR | Status: DC | PRN
  Administered 2014-07-10: 12 mL/h via EPIDURAL

## 2014-07-10 MED ORDER — ZOLPIDEM TARTRATE 5 MG PO TABS: 5.0000 mg | ORAL_TABLET | Freq: Every evening | ORAL | Status: DC | PRN

## 2014-07-10 MED ORDER — WITCH HAZEL-GLYCERIN EX PADS: 1.0000 "application " | MEDICATED_PAD | CUTANEOUS | Status: DC | PRN

## 2014-07-10 MED ORDER — TERBUTALINE SULFATE 1 MG/ML IJ SOLN
0.2500 mg | Freq: Once | INTRAMUSCULAR | Status: AC | PRN
  Administered 2014-07-10: 0.25 mg via SUBCUTANEOUS
  Filled 2014-07-10: qty 1

## 2014-07-10 MED ORDER — TETANUS-DIPHTH-ACELL PERTUSSIS 5-2.5-18.5 LF-MCG/0.5 IM SUSP
0.5000 mL | Freq: Once | INTRAMUSCULAR | Status: AC
  Administered 2014-07-10: 0.5 mL via INTRAMUSCULAR

## 2014-07-10 MED ORDER — ACETAMINOPHEN 325 MG PO TABS: 650.0000 mg | ORAL_TABLET | ORAL | Status: DC | PRN

## 2014-07-10 MED ORDER — PHENYLEPHRINE 40 MCG/ML (10ML) SYRINGE FOR IV PUSH (FOR BLOOD PRESSURE SUPPORT)
80.0000 ug | PREFILLED_SYRINGE | INTRAVENOUS | Status: DC | PRN
  Filled 2014-07-10: qty 2

## 2014-07-10 MED ORDER — FLEET ENEMA 7-19 GM/118ML RE ENEM: 1.0000 | ENEMA | RECTAL | Status: DC | PRN

## 2014-07-10 MED ORDER — OXYTOCIN 40 UNITS IN LACTATED RINGERS INFUSION - SIMPLE MED
1.0000 m[IU]/min | INTRAVENOUS | Status: DC
  Administered 2014-07-10: 2 m[IU]/min via INTRAVENOUS

## 2014-07-10 MED ORDER — SENNOSIDES-DOCUSATE SODIUM 8.6-50 MG PO TABS
2.0000 | ORAL_TABLET | ORAL | Status: DC
  Administered 2014-07-11: 2 via ORAL
  Filled 2014-07-10 (×2): qty 2

## 2014-07-10 MED ORDER — LACTATED RINGERS IV SOLN
500.0000 mL | INTRAVENOUS | Status: DC | PRN
  Administered 2014-07-10: 500 mL via INTRAVENOUS

## 2014-07-10 MED ORDER — SODIUM CHLORIDE 0.9 % IV SOLN
2.0000 g | Freq: Once | INTRAVENOUS | Status: DC
  Filled 2014-07-10: qty 2000

## 2014-07-10 MED ORDER — FENTANYL 2.5 MCG/ML BUPIVACAINE 1/10 % EPIDURAL INFUSION (WH - ANES)
14.0000 mL/h | INTRAMUSCULAR | Status: DC | PRN
  Administered 2014-07-10: 14 mL/h via EPIDURAL
  Filled 2014-07-10: qty 125

## 2014-07-10 MED ORDER — IBUPROFEN 600 MG PO TABS
600.0000 mg | ORAL_TABLET | Freq: Four times a day (QID) | ORAL | Status: DC
  Administered 2014-07-10 – 2014-07-12 (×6): 600 mg via ORAL
  Filled 2014-07-10 (×7): qty 1

## 2014-07-10 MED ORDER — FENTANYL 2.5 MCG/ML BUPIVACAINE 1/10 % EPIDURAL INFUSION (WH - ANES): 12.0000 mL/h | INTRAMUSCULAR | Status: DC | PRN

## 2014-07-10 MED ORDER — BENZOCAINE-MENTHOL 20-0.5 % EX AERO
1.0000 "application " | INHALATION_SPRAY | CUTANEOUS | Status: DC | PRN
  Administered 2014-07-10: 1 via TOPICAL
  Filled 2014-07-10: qty 56

## 2014-07-10 MED ORDER — ONDANSETRON HCL 4 MG/2ML IJ SOLN: 4.0000 mg | Freq: Four times a day (QID) | INTRAMUSCULAR | Status: DC | PRN

## 2014-07-10 MED ORDER — DIPHENHYDRAMINE HCL 25 MG PO CAPS: 25.0000 mg | ORAL_CAPSULE | Freq: Four times a day (QID) | ORAL | Status: DC | PRN

## 2014-07-10 MED ORDER — OXYCODONE-ACETAMINOPHEN 5-325 MG PO TABS: 1.0000 | ORAL_TABLET | ORAL | Status: DC | PRN

## 2014-07-10 NOTE — Lactation Note (Signed)
This note was copied from the chart of Boy Addisyn Saur. Lactation Consultation Note  Patient Name: Boy Faduma Cho Today's Date: 07/10/2014 Reason for consult: Initial assessment of this second-time mother and her newborn at 52 hours postpartum.  Mom has visitors who are holding baby but states that her baby has latched >4 times since birth.  Mom has a 75 month old baby but she did not breastfeed her.  Newborn has LATCH scores of 7/8 per RN assessment and feedings of 10-30 minutes each.  LC encouraged frequent STS and cue feedings. Mom encouraged to feed baby 8-12 times/24 hours and with feeding cues. LC encouraged review of Baby and Me pp 9, 14 and 20-25 for STS and BF information. LC provided [employer] brochure and reviewed [facility] services and list of community and web site resources.     Maternal Data Formula Feeding for Exclusion: No Has patient been taught Hand Expression?: Yes (mom says her nurse has shown her hand expression) Does the patient have breastfeeding experience prior to this delivery?: No (mom has 58 month old child but she did not breastfeed her)  Feeding    LATCH Score/Interventions               Most recent LATCH score=7       Lactation Tools Discussed/Used   STS, hand expression, cue feedings  Consult Status Consult Status: Follow-up Date: 07/11/14 Follow-up type: In-patient    Warrick Parisian [facility] 07/10/2014, 9:07 PM

## 2014-07-10 NOTE — Progress Notes (Signed)
  Subjective: Comfortable, yet itchy from epidural. Family at bedside.  Objective: BP 112/55  Pulse 81  Temp(Src) 97.3 F (36.3 C) (Oral)  Resp 18  Ht  (1.549 m)  Wt 151 lb (68.493 kg)  BMI 28.55 kg/m2  SpO2 99%  LMP 10/09/2013       FHT: BL 130 w/ mod variability, +accels, no decels UC:   irregular, every 2-5 minutes SVE:   Dilation: 5 Effacement (%): 70 Station: -2 Exam by:: Thornell Mule AROM'd, scant amount of clear fluid noted IUPC placed  Assessment:  NICHD, Cat 1 No cervical change  Plan: Rv'd Pitocin augmentation w/ pt. R&B to include failure method and requirement for c-section reviewed. Pt seem to understand these risks and wishes to proceed w/ augmentation. Expect progress and SVD. Consult as indicated.  Sherre Scarlet CNM 07/10/2014, 5:20 AM

## 2014-07-10 NOTE — Anesthesia Preprocedure Evaluation (Signed)
Anesthesia Evaluation  Patient identified by MRN, date of birth, ID band Patient awake    Reviewed: Allergy & Precautions, H&P , NPO status , Patient's Chart, lab work & pertinent test results  Airway Mallampati: II TM Distance: >3 FB Neck ROM: full    Dental  (+) Teeth Intact   Pulmonary neg pulmonary ROS,  breath sounds clear to auscultation        Cardiovascular negative cardio ROS  Rhythm:regular Rate:Normal     Neuro/Psych negative neurological ROS  negative psych ROS   GI/Hepatic negative GI ROS, Neg liver ROS,   Endo/Other  negative endocrine ROS  Renal/GU Renal disease     Musculoskeletal negative musculoskeletal ROS (+)   Abdominal   Peds  Hematology negative hematology ROS (+)   Anesthesia Other Findings       Reproductive/Obstetrics (+) Pregnancy                           Anesthesia Physical  Anesthesia Plan  ASA: II  Anesthesia Plan: Epidural   Post-op Pain Management:    Induction:   Airway Management Planned:   Additional Equipment:   Intra-op Plan:   Post-operative Plan:   Informed Consent: I have reviewed the patients History and Physical, chart, labs and discussed the procedure including the risks, benefits and alternatives for the proposed anesthesia with the patient or authorized representative who has indicated his/her understanding and acceptance.     Plan Discussed with:   Anesthesia Plan Comments:         Anesthesia Quick Evaluation

## 2014-07-10 NOTE — Progress Notes (Signed)
UR completed 

## 2014-07-10 NOTE — Anesthesia Procedure Notes (Signed)
Epidural Patient location during procedure: OB  Staffing Anesthesiologist: Michaelpaul Apo R Performed by: anesthesiologist   Preanesthetic Checklist Completed: patient identified, pre-op evaluation, timeout performed, IV checked, risks and benefits discussed and monitors and equipment checked  Epidural Patient position: sitting Prep: site prepped and draped and DuraPrep Patient monitoring: heart rate Approach: midline Location: L3-L4 Injection technique: LOR air and LOR saline  Needle:  Needle type: Tuohy  Needle gauge: 17 G Needle length: 9 cm Needle insertion depth: 5 cm Catheter type: closed end flexible Catheter size: 19 Gauge Catheter at skin depth: 11 cm Test dose: negative  Assessment Sensory level: T8 Events: blood not aspirated, injection not painful, no injection resistance, negative IV test and no paresthesia  Additional Notes Reason for block:procedure for pain   

## 2014-07-10 NOTE — Anesthesia Postprocedure Evaluation (Signed)
Anesthesia Post Note  Patient: Lindsay Whitney  Procedure(s) Performed: * No procedures listed *  Anesthesia type: Epidural  Patient location: Mother/Baby  Post pain: Pain level controlled  Post assessment: Post-op Vital signs reviewed  Last Vitals:  Filed Vitals:   07/10/14 1100  BP: 109/55  Pulse: 94  Temp: 37.1 C  Resp: 18    Post vital signs: Reviewed  Level of consciousness:alert  Complications: No apparent anesthesia complications
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
## 2014-07-10 NOTE — Progress Notes (Addendum)
  Subjective: Feeling pressure  Objective: BP 108/60  Pulse 100  Temp(Src) 97.3 F (36.3 C) (Oral)  Resp 18  Ht  (1.549 m)  Wt 151 lb (68.493 kg)  BMI 28.55 kg/m2  SpO2 99%  LMP 10/09/2013     FHT: 130 w/ mod variability, lates UC:   irregular, every 1-2 minutes SVE:   Dilation: 8.5 Effacement (%): 70 Station: -2 Exam by:: k. Dorine Duffey  Pit off  Assessment:  Tachysystole NICHD, Cat 2 but w/ mod variability  Plan: Terb Continue intrauterine resuscitative measures Expect progress and SVD  Lindsay Whitney CNM 07/10/2014, 6:49 AM

## 2014-07-10 NOTE — Progress Notes (Signed)
S: Pt feeling more pressure; wants to push. O: Terb given x 1 due to tachysystole Cervix C/C/0 per RN A: 2nd stage labor P: Anticipate SVD briefly   Sherre Scarlet, CNM, MS 07/10/14 @ 7:45 AM

## 2014-07-11 LAB — CBC
HCT: 28.9 % — ABNORMAL LOW (ref 36.0–46.0)
Hemoglobin: 9.8 g/dL — ABNORMAL LOW (ref 12.0–15.0)
MCH: 29.1 pg (ref 26.0–34.0)
MCHC: 33.9 g/dL (ref 30.0–36.0)
MCV: 85.8 fL (ref 78.0–100.0)
PLATELETS: 120 10*3/uL — AB (ref 150–400)
RBC: 3.37 MIL/uL — AB (ref 3.87–5.11)
RDW: 14.9 % (ref 11.5–15.5)
WBC: 9.6 10*3/uL (ref 4.0–10.5)

## 2014-07-11 NOTE — Lactation Note (Signed)
This note was copied from the chart of Lindsay Alessia Pozo. Lactation Consultation Note Mom eating and barely acknowledged LC. Asked mom if she has any concerns about breastfeeding, mom shook head "no" without eye contact. Asked mom if there is anything I can do to help her, and she again shook head "no" without eye contact. Asked mom if she knows how to get help if needed, and she nodded "yes".      Patient Name: Lindsay Whitney Today's Date: 07/11/2014     Maternal Data    Feeding Length of feed: 10 min  LATCH Score/Interventions                      Lactation Tools Discussed/Used     Consult Status      Lenard Forth 07/11/2014, 2:46 PM

## 2014-07-11 NOTE — Progress Notes (Signed)
Lindsay Whitney    Subjective: Post Partum Day 1 Vaginal delivery, 2 degree laceration Patient up ad lib, denies syncope or dizziness. Reports consuming regular diet without issues and denies N/V No issues with urination and reports bleeding is appropriate  Feeding:  breastfeeding Contraceptive plan:   Nexplanon  Objective: Temp:  [97.3 F (36.3 C)-98.8 F (37.1 C)] 98 F (36.7 C) (09/04 0612) Pulse Rate:  [60-94] 60 (09/04 0612) Resp:  [15-18] 15 (09/04 0612) BP: (103-109)/(50-70) 103/50 mmHg (09/04 0612)  Physical Exam:  General: alert and cooperative Ext: WNL, no edema. No evidence of DVT seen on physical exam. Breast: Soft filling Lungs: CTAB Heart RRR without murmur  Abdomen:  Soft, fundus firm, lochia scant, + bowel sounds, non distended, non tender Lochia: appropriate Uterine Fundus: firm Laceration: healing well    Recent Labs  07/10/14 0050 07/11/14 0617  HGB 11.6* 9.8*  HCT 32.9* 28.9*    Assessment S/P Vaginal Delivery-Day 1 Stable  Normal Involution Breastfeeding Circumcision: out patient  Plan: Continue current care Dr. Su Hilt updated on patient status  Breastfeeding and Lactation consult Today's discharge pending the discharge of the baby   Benton Tooker, CNM, MSN 07/11/2014, 10:03 AM

## 2014-07-12 LAB — CBC
HEMATOCRIT: 32.1 % — AB (ref 36.0–46.0)
Hemoglobin: 10.5 g/dL — ABNORMAL LOW (ref 12.0–15.0)
MCH: 28.2 pg (ref 26.0–34.0)
MCHC: 32.7 g/dL (ref 30.0–36.0)
MCV: 86.1 fL (ref 78.0–100.0)
Platelets: 121 10*3/uL — ABNORMAL LOW (ref 150–400)
RBC: 3.73 MIL/uL — ABNORMAL LOW (ref 3.87–5.11)
RDW: 14.9 % (ref 11.5–15.5)
WBC: 8.5 10*3/uL (ref 4.0–10.5)

## 2014-07-12 MED ORDER — IBUPROFEN 600 MG PO TABS: 600.0000 mg | ORAL_TABLET | Freq: Four times a day (QID) | ORAL | Status: DC | PRN

## 2014-07-12 MED ORDER — OXYCODONE-ACETAMINOPHEN 5-325 MG PO TABS: 1.0000 | ORAL_TABLET | ORAL | Status: DC | PRN

## 2014-07-12 MED ORDER — FERROUS SULFATE 325 (65 FE) MG PO TABS
325.0000 mg | ORAL_TABLET | Freq: Two times a day (BID) | ORAL | Status: DC
Start: 2014-07-12 — End: 2014-07-12
  Administered 2014-07-12: 325 mg via ORAL
  Filled 2014-07-12: qty 1

## 2014-07-12 MED ORDER — FERROUS SULFATE 325 (65 FE) MG PO TABS: 325.0000 mg | ORAL_TABLET | Freq: Every day | ORAL | Status: DC
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
## 2014-07-12 NOTE — Discharge Instructions (Signed)
Iron-Rich Diet  An iron-rich diet contains foods that are good sources of iron. Iron is an important mineral that helps your body produce hemoglobin. Hemoglobin is a protein in red blood cells that carries oxygen to the body's tissues. Sometimes, the iron level in your blood can be low. This may be caused by:  A lack of iron in your diet.  Blood loss.  Times of growth, such as during pregnancy or during a child's growth and development. Low levels of iron can cause a decrease in the number of red blood cells. This can result in iron deficiency anemia. Iron deficiency anemia symptoms include:  Tiredness.  Weakness.  Irritability.  Increased chance of infection. Here are some recommendations for daily iron intake:  Males older than 20 years of age need 8 mg of iron per day.  Women ages 32 to 90 need 18 mg of iron per day.  Pregnant women need 27 mg of iron per day, and women who are over 86 years of age and breastfeeding need 9 mg of iron per day.  Women over the age of 39 need 8 mg of iron per day. SOURCES OF IRON There are 2 types of iron that are found in food: heme iron and nonheme iron. Heme iron is absorbed by the body better than nonheme iron. Heme iron is found in meat, poultry, and fish. Nonheme iron is found in grains, beans, and vegetables. Heme Iron Sources Food / Iron (mg)  Chicken liver, 3 oz (85 g)/ 10 mg  Beef liver, 3 oz (85 g)/ 5.5 mg  Oysters, 3 oz (85 g)/ 8 mg  Beef, 3 oz (85 g)/ 2 to 3 mg  Shrimp, 3 oz (85 g)/ 2.8 mg  [place], 3 oz (85 g)/ 2 mg  Chicken, 3 oz (85 g) / 1 mg  Fish (tuna, halibut), 3 oz (85 g)/ 1 mg  Pork, 3 oz (85 g)/ 0.9 mg Nonheme Iron Sources Food / Iron (mg)  Ready-to-eat breakfast cereal, iron-fortified / 3.9 to 7 mg  Tofu,  cup / 3.4 mg  Kidney beans,  cup / 2.6 mg  Baked potato with skin / 2.7 mg  Asparagus,  cup / 2.2 mg  Avocado / 2 mg  Dried peaches,  cup / 1.6 mg  Raisins,  cup / 1.5 mg  Soy milk, 1  cup / 1.5 mg  Whole-wheat bread, 1 slice / 1.2 mg  Spinach, 1 cup / 0.8 mg  Broccoli,  cup / 0.6 mg IRON ABSORPTION Certain foods can decrease the body's absorption of iron. Try to avoid these foods and beverages while eating meals with iron-containing foods:  Coffee.  Tea.  Fiber.  Soy. Foods containing vitamin C can help increase the amount of iron your body absorbs from iron sources, especially from nonheme sources. Eat foods with vitamin C along with iron-containing foods to increase your iron absorption. Foods that are high in vitamin C include many fruits and vegetables. Some good sources are:  Fresh orange juice.  Oranges.  Strawberries.  Mangoes.  Grapefruit.  Red bell peppers.  Green bell peppers.  Broccoli.  Potatoes with skin.  Tomato juice. Document Released: 06/07/2005 Document Revised: 01/16/2012 Document Reviewed: 04/14/2011 [facility] Patient Information 2015 [place], [place]. This information is not intended to replace advice given to you by your health care provider. Make sure you discuss any questions you have with your health care provider. Postpartum Care After Vaginal Delivery After you deliver your newborn (postpartum period), the usual stay  in the hospital is 24-72 hours. If there were problems with your labor or delivery, or if you have other medical problems, you might be in the hospital longer.  While you are in the hospital, you will receive help and instructions on how to care for yourself and your newborn during the postpartum period.  While you are in the hospital:  Be sure to tell your nurses if you have pain or discomfort, as well as where you feel the pain and what makes the pain worse.  If you had an incision made near your vagina (episiotomy) or if you had some tearing during delivery, the nurses may put ice packs on your episiotomy or tear. The ice packs may help to reduce the pain and swelling.  If you are breastfeeding, you may  feel uncomfortable contractions of your uterus for a couple of weeks. This is normal. The contractions help your uterus get back to normal size.  It is normal to have some bleeding after delivery.  For the first 1-3 days after delivery, the flow is red and the amount may be similar to a period.  It is common for the flow to start and stop.  In the first few days, you may pass some small clots. Let your nurses know if you begin to pass large clots or your flow increases.  Do not  flush blood clots down the toilet before having the nurse look at them.  During the next 3-10 days after delivery, your flow should become more watery and pink or brown-tinged in color.  Ten to fourteen days after delivery, your flow should be a small amount of yellowish-white discharge.  The amount of your flow will decrease over the first few weeks after delivery. Your flow may stop in 6-8 weeks. Most women have had their flow stop by 12 weeks after delivery.  You should change your sanitary pads frequently.  Wash your hands thoroughly with soap and water for at least 20 seconds after changing pads, using the toilet, or before holding or feeding your newborn.  You should feel like you need to empty your bladder within the first 6-8 hours after delivery.  In case you become weak, lightheaded, or faint, call your nurse before you get out of bed for the first time and before you take a shower for the first time.  Within the first few days after delivery, your breasts may begin to feel tender and full. This is called engorgement. Breast tenderness usually goes away within 48-72 hours after engorgement occurs. You may also notice milk leaking from your breasts. If you are not breastfeeding, do not stimulate your breasts. Breast stimulation can make your breasts produce more milk.  Spending as much time as possible with your newborn is very important. During this time, you and your newborn can feel close and get to  know each other. Having your newborn stay in your room (rooming in) will help to strengthen the bond with your newborn. It will give you time to get to know your newborn and become comfortable caring for your newborn.  Your hormones change after delivery. Sometimes the hormone changes can temporarily cause you to feel sad or tearful. These feelings should not last more than a few days. If these feelings last longer than that, you should talk to your caregiver.  If desired, talk to your caregiver about methods of family planning or contraception.  Talk to your caregiver about immunizations. Your caregiver may want you to have  the following immunizations before leaving the hospital:  Tetanus, diphtheria, and pertussis (Tdap) or tetanus and diphtheria (Td) immunization. It is very important that you and your family (including grandparents) or others caring for your newborn are up-to-date with the Tdap or Td immunizations. The Tdap or Td immunization can help protect your newborn from getting ill.  Rubella immunization.  Varicella (chickenpox) immunization.  Influenza immunization. You should receive this annual immunization if you did not receive the immunization during your pregnancy. Document Released: 08/21/2007 Document Revised: 07/18/2012 Document Reviewed: 06/20/2012 [facility] Patient Information 2015 [place], [place]. This information is not intended to replace advice given to you by your health care provider. Make sure you discuss any questions you have with your health care provider.

## 2014-09-08 ENCOUNTER — Encounter (HOSPITAL_COMMUNITY): Payer: Self-pay

## 2017-07-07 ENCOUNTER — Encounter (HOSPITAL_COMMUNITY): Payer: Self-pay | Admitting: Emergency Medicine

## 2017-07-07 ENCOUNTER — Emergency Department (HOSPITAL_COMMUNITY)
Admission: EM | Admit: 2017-07-07 | Discharge: 2017-07-07 | Disposition: A | Discharge status: Home / Self Care | Payer: Medicaid Other | Attending: Emergency Medicine | Admitting: Emergency Medicine

## 2017-07-07 DIAGNOSIS — K0889 Other specified disorders of teeth and supporting structures: Secondary | ICD-10-CM | POA: Insufficient documentation

## 2017-07-07 MED ORDER — HYDROCODONE-ACETAMINOPHEN 5-325 MG PO TABS
1.0000 | ORAL_TABLET | Freq: Once | ORAL | Status: AC
  Administered 2017-07-07: 1 via ORAL
  Filled 2017-07-07: qty 1

## 2017-07-07 MED ORDER — AMOXICILLIN 500 MG PO CAPS: 500.0000 mg | ORAL_CAPSULE | Freq: Three times a day (TID) | ORAL | 0 refills | Status: DC
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
## 2017-07-07 NOTE — ED Triage Notes (Signed)
Patient presents with left dental pain, specifically gum pain which has gotten progressively worse. Patient has not seen a dentist for this pain. Denies swelling, tingling or numbness.

## 2017-07-07 NOTE — Discharge Instructions (Signed)
Please start taking the antibiotics to hep with infection. For pain take motrin and tylenol For pain. May use over-the-counter Orajel. Please follow-up with dentist. We'll have a dentist on call today but used the dental resource guide to find reduced dental procedures in the area.

## 2018-02-07 ENCOUNTER — Encounter (HOSPITAL_COMMUNITY): Payer: Self-pay

## 2018-02-07 ENCOUNTER — Emergency Department (HOSPITAL_COMMUNITY): Payer: Self-pay

## 2018-02-07 ENCOUNTER — Emergency Department (HOSPITAL_COMMUNITY)
Admission: EM | Admit: 2018-02-07 | Discharge: 2018-02-07 | Disposition: A | Discharge status: Home / Self Care | Payer: Self-pay | Attending: Emergency Medicine | Admitting: Emergency Medicine

## 2018-02-07 ENCOUNTER — Other Ambulatory Visit: Payer: Self-pay

## 2018-02-07 DIAGNOSIS — B349 Viral infection, unspecified: Secondary | ICD-10-CM | POA: Insufficient documentation

## 2018-02-07 DIAGNOSIS — Z79899 Other long term (current) drug therapy: Secondary | ICD-10-CM | POA: Insufficient documentation

## 2018-02-07 LAB — RAPID STREP SCREEN (MED CTR MEBANE ONLY): Streptococcus, Group A Screen (Direct): NEGATIVE

## 2018-02-07 LAB — POC URINE PREG, ED: Preg Test, Ur: NEGATIVE

## 2018-02-07 MED ORDER — BENZONATATE 100 MG PO CAPS: 100.0000 mg | ORAL_CAPSULE | Freq: Three times a day (TID) | ORAL | 0 refills | Status: DC

## 2018-02-07 MED ORDER — ACETAMINOPHEN 325 MG PO TABS
650.0000 mg | ORAL_TABLET | Freq: Once | ORAL | Status: AC | PRN
  Administered 2018-02-07: 650 mg via ORAL
  Filled 2018-02-07: qty 2

## 2018-02-07 MED ORDER — GUAIFENESIN 200 MG PO TABS: 200.0000 mg | ORAL_TABLET | ORAL | 0 refills | Status: DC | PRN

## 2018-02-07 NOTE — ED Notes (Signed)
Patient transported to X-ray 

## 2018-02-07 NOTE — ED Triage Notes (Signed)
Patient c/o a non productive cough, fever, and sore throat x 3 days.
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
## 2018-02-10 LAB — CULTURE, GROUP A STREP (THRC)

## 2019-04-03 ENCOUNTER — Other Ambulatory Visit: Payer: Self-pay

## 2019-04-03 ENCOUNTER — Encounter (HOSPITAL_COMMUNITY): Payer: Self-pay | Admitting: Emergency Medicine

## 2019-04-03 ENCOUNTER — Emergency Department (HOSPITAL_COMMUNITY)
Admission: EM | Admit: 2019-04-03 | Discharge: 2019-04-03 | Disposition: A | Discharge status: Home / Self Care | Payer: Medicaid Other | Attending: Emergency Medicine | Admitting: Emergency Medicine

## 2019-04-03 DIAGNOSIS — N938 Other specified abnormal uterine and vaginal bleeding: Secondary | ICD-10-CM | POA: Insufficient documentation

## 2019-04-03 DIAGNOSIS — Z79899 Other long term (current) drug therapy: Secondary | ICD-10-CM | POA: Insufficient documentation

## 2019-04-03 LAB — CBC WITH DIFFERENTIAL/PLATELET
Abs Immature Granulocytes: 0.02 10*3/uL (ref 0.00–0.07)
Basophils Absolute: 0 10*3/uL (ref 0.0–0.1)
Basophils Relative: 1 %
Eosinophils Absolute: 0.2 10*3/uL (ref 0.0–0.5)
Eosinophils Relative: 2 %
HCT: 36.5 % (ref 36.0–46.0)
Hemoglobin: 12.9 g/dL (ref 12.0–15.0)
Immature Granulocytes: 0 %
Lymphocytes Relative: 56 %
Lymphs Abs: 3.7 10*3/uL (ref 0.7–4.0)
MCH: 31.1 pg (ref 26.0–34.0)
MCHC: 35.3 g/dL (ref 30.0–36.0)
MCV: 88 fL (ref 80.0–100.0)
Monocytes Absolute: 0.5 10*3/uL (ref 0.1–1.0)
Monocytes Relative: 7 %
Neutro Abs: 2.2 10*3/uL (ref 1.7–7.7)
Neutrophils Relative %: 34 %
Platelets: 272 10*3/uL (ref 150–400)
RBC: 4.15 MIL/uL (ref 3.87–5.11)
RDW: 12 % (ref 11.5–15.5)
WBC: 6.6 10*3/uL (ref 4.0–10.5)
nRBC: 0 % (ref 0.0–0.2)

## 2019-04-03 LAB — COMPREHENSIVE METABOLIC PANEL
ALT: 17 U/L (ref 0–44)
AST: 19 U/L (ref 15–41)
Albumin: 4.2 g/dL (ref 3.5–5.0)
Alkaline Phosphatase: 49 U/L (ref 38–126)
Anion gap: 7 (ref 5–15)
BUN: 15 mg/dL (ref 6–20)
CO2: 23 mmol/L (ref 22–32)
Calcium: 9.6 mg/dL (ref 8.9–10.3)
Chloride: 107 mmol/L (ref 98–111)
Creatinine, Ser: 0.6 mg/dL (ref 0.44–1.00)
GFR calc Af Amer: >60 mL/min (ref >60)
GFR calc non Af Amer: >60 mL/min (ref >60)
Glucose, Bld: 126 mg/dL — ABNORMAL HIGH (ref 70–99)
Potassium: 3.2 mmol/L — ABNORMAL LOW (ref 3.5–5.1)
Sodium: 137 mmol/L (ref 135–145)
Total Bilirubin: 0.7 mg/dL (ref 0.3–1.2)
Total Protein: 7.9 g/dL (ref 6.5–8.1)

## 2019-04-03 LAB — I-STAT BETA HCG BLOOD, ED (MC, WL, AP ONLY): I-stat hCG, quantitative: 13.5 m[IU]/mL — ABNORMAL HIGH (ref <5)

## 2019-04-03 LAB — TYPE AND SCREEN
ABO/RH(D): O POS
Antibody Screen: NEGATIVE

## 2019-04-03 LAB — HCG, QUANTITATIVE, PREGNANCY: hCG, Beta Chain, Quant, S: 15 m[IU]/mL — ABNORMAL HIGH (ref <5)

## 2019-04-03 MED ORDER — ACETAMINOPHEN 500 MG PO TABS
1000.0000 mg | ORAL_TABLET | Freq: Once | ORAL | Status: AC
  Administered 2019-04-03: 23:00:00 1000 mg via ORAL
  Filled 2019-04-03: qty 2

## 2019-04-03 NOTE — ED Triage Notes (Addendum)
Patient c/o heavy vaginal bleeding x with clots. Also reports lower abdominal pain. Denies pelvic surgeries.
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
## 2019-04-03 NOTE — ED Notes (Signed)
Lab called for hCG to be run

## 2019-06-07 ENCOUNTER — Other Ambulatory Visit: Payer: Self-pay

## 2019-06-07 DIAGNOSIS — Z20822 Contact with and (suspected) exposure to covid-19: Secondary | ICD-10-CM

## 2019-06-09 LAB — NOVEL CORONAVIRUS, NAA: SARS-CoV-2, NAA: NOT DETECTED

## 2019-09-30 ENCOUNTER — Encounter (HOSPITAL_COMMUNITY): Payer: Self-pay

## 2019-09-30 ENCOUNTER — Other Ambulatory Visit: Payer: Self-pay

## 2019-09-30 ENCOUNTER — Emergency Department (HOSPITAL_COMMUNITY)
Admission: EM | Admit: 2019-09-30 | Discharge: 2019-09-30 | Disposition: A | Discharge status: Home / Self Care | Payer: Self-pay | Attending: Emergency Medicine | Admitting: Emergency Medicine

## 2019-09-30 ENCOUNTER — Emergency Department (HOSPITAL_COMMUNITY): Payer: Self-pay

## 2019-09-30 DIAGNOSIS — N3001 Acute cystitis with hematuria: Secondary | ICD-10-CM

## 2019-09-30 DIAGNOSIS — Z79899 Other long term (current) drug therapy: Secondary | ICD-10-CM | POA: Insufficient documentation

## 2019-09-30 LAB — COMPREHENSIVE METABOLIC PANEL
ALT: 18 U/L (ref 0–44)
AST: 24 U/L (ref 15–41)
Albumin: 4.1 g/dL (ref 3.5–5.0)
Alkaline Phosphatase: 47 U/L (ref 38–126)
Anion gap: 11 (ref 5–15)
BUN: 9 mg/dL (ref 6–20)
CO2: 22 mmol/L (ref 22–32)
Calcium: 9.9 mg/dL (ref 8.9–10.3)
Chloride: 105 mmol/L (ref 98–111)
Creatinine, Ser: 0.69 mg/dL (ref 0.44–1.00)
GFR calc Af Amer: >60 mL/min (ref >60)
GFR calc non Af Amer: >60 mL/min (ref >60)
Glucose, Bld: 93 mg/dL (ref 70–99)
Potassium: 3.4 mmol/L — ABNORMAL LOW (ref 3.5–5.1)
Sodium: 138 mmol/L (ref 135–145)
Total Bilirubin: 1 mg/dL (ref 0.3–1.2)
Total Protein: 7.9 g/dL (ref 6.5–8.1)

## 2019-09-30 LAB — CBC WITH DIFFERENTIAL/PLATELET
Abs Immature Granulocytes: 0.04 10*3/uL (ref 0.00–0.07)
Basophils Absolute: 0 10*3/uL (ref 0.0–0.1)
Basophils Relative: 0 %
Eosinophils Absolute: 0 10*3/uL (ref 0.0–0.5)
Eosinophils Relative: 0 %
HCT: 39.4 % (ref 36.0–46.0)
Hemoglobin: 13.3 g/dL (ref 12.0–15.0)
Immature Granulocytes: 0 %
Lymphocytes Relative: 15 %
Lymphs Abs: 1.6 10*3/uL (ref 0.7–4.0)
MCH: 30.6 pg (ref 26.0–34.0)
MCHC: 33.8 g/dL (ref 30.0–36.0)
MCV: 90.6 fL (ref 80.0–100.0)
Monocytes Absolute: 0.8 10*3/uL (ref 0.1–1.0)
Monocytes Relative: 8 %
Neutro Abs: 8.1 10*3/uL — ABNORMAL HIGH (ref 1.7–7.7)
Neutrophils Relative %: 77 %
Platelets: 199 10*3/uL (ref 150–400)
RBC: 4.35 MIL/uL (ref 3.87–5.11)
RDW: 11.9 % (ref 11.5–15.5)
WBC: 10.6 10*3/uL — ABNORMAL HIGH (ref 4.0–10.5)
nRBC: 0 % (ref 0.0–0.2)

## 2019-09-30 LAB — URINALYSIS, ROUTINE W REFLEX MICROSCOPIC
Bilirubin Urine: NEGATIVE
Glucose, UA: NEGATIVE mg/dL
Ketones, ur: 20 mg/dL — AB
Nitrite: NEGATIVE
Protein, ur: NEGATIVE mg/dL
Specific Gravity, Urine: 1.014 (ref 1.005–1.030)
pH: 6 (ref 5.0–8.0)

## 2019-09-30 LAB — LIPASE, BLOOD: Lipase: 29 U/L (ref 11–51)

## 2019-09-30 LAB — I-STAT BETA HCG BLOOD, ED (MC, WL, AP ONLY): I-stat hCG, quantitative: <5 m[IU]/mL (ref <5)

## 2019-09-30 MED ORDER — MORPHINE SULFATE (PF) 4 MG/ML IV SOLN
4.0000 mg | Freq: Once | INTRAVENOUS | Status: AC
Start: 2019-09-30 — End: 2019-09-30
  Administered 2019-09-30: 14:00:00 4 mg via INTRAVENOUS
  Filled 2019-09-30: qty 1

## 2019-09-30 MED ORDER — KETOROLAC TROMETHAMINE 30 MG/ML IJ SOLN
30.0000 mg | Freq: Once | INTRAMUSCULAR | Status: AC
  Administered 2019-09-30: 30 mg via INTRAVENOUS
  Filled 2019-09-30: qty 1

## 2019-09-30 MED ORDER — CEPHALEXIN 500 MG PO CAPS: 500.0000 mg | ORAL_CAPSULE | Freq: Two times a day (BID) | ORAL | 0 refills | Status: AC

## 2019-09-30 MED ORDER — ONDANSETRON HCL 4 MG/2ML IJ SOLN
4.0000 mg | Freq: Once | INTRAMUSCULAR | Status: AC
  Administered 2019-09-30: 14:00:00 4 mg via INTRAVENOUS
  Filled 2019-09-30: qty 2

## 2019-09-30 MED ORDER — SODIUM CHLORIDE 0.9 % IV BOLUS
1000.0000 mL | Freq: Once | INTRAVENOUS | Status: AC
  Administered 2019-09-30: 14:00:00 1000 mL via INTRAVENOUS

## 2019-09-30 NOTE — ED Notes (Signed)
Patient tolerating PO fluids 
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
## 2019-09-30 NOTE — ED Notes (Signed)
Family at bedside. 

## 2019-09-30 NOTE — ED Triage Notes (Signed)
Pt presents with c/o right flank pain that started last night. Pt reports the pain has progressively gotten worse and is now unbearable. Hx of kidney stones, denies hematuria at this time.

## 2019-09-30 NOTE — Discharge Instructions (Signed)

## 2019-10-02 LAB — URINE CULTURE: Culture: 80000 — AB

## 2019-10-03 ENCOUNTER — Encounter (HOSPITAL_COMMUNITY): Payer: Self-pay | Admitting: Obstetrics and Gynecology

## 2019-10-03 ENCOUNTER — Telehealth: Payer: Self-pay | Admitting: Emergency Medicine

## 2019-10-03 ENCOUNTER — Emergency Department (HOSPITAL_COMMUNITY)
Admission: EM | Admit: 2019-10-03 | Discharge: 2019-10-03 | Disposition: A | Discharge status: Home / Self Care | Payer: Medicaid Other | Attending: Emergency Medicine | Admitting: Emergency Medicine

## 2019-10-03 ENCOUNTER — Other Ambulatory Visit: Payer: Self-pay

## 2019-10-03 DIAGNOSIS — B9620 Unspecified Escherichia coli [E. coli] as the cause of diseases classified elsewhere: Secondary | ICD-10-CM | POA: Insufficient documentation

## 2019-10-03 DIAGNOSIS — Z79899 Other long term (current) drug therapy: Secondary | ICD-10-CM | POA: Insufficient documentation

## 2019-10-03 DIAGNOSIS — N10 Acute tubulo-interstitial nephritis: Secondary | ICD-10-CM | POA: Insufficient documentation

## 2019-10-03 MED ORDER — ONDANSETRON 4 MG PO TBDP: 4.0000 mg | ORAL_TABLET | Freq: Three times a day (TID) | ORAL | 0 refills | Status: DC | PRN

## 2019-10-03 MED ORDER — SULFAMETHOXAZOLE-TRIMETHOPRIM 800-160 MG PO TABS
1.0000 | ORAL_TABLET | Freq: Once | ORAL | Status: AC
  Administered 2019-10-03: 16:00:00 1 via ORAL
  Filled 2019-10-03: qty 1

## 2019-10-03 MED ORDER — SULFAMETHOXAZOLE-TRIMETHOPRIM 800-160 MG PO TABS: 1.0000 | ORAL_TABLET | Freq: Two times a day (BID) | ORAL | 0 refills | Status: AC

## 2019-10-03 NOTE — Telephone Encounter (Signed)
Post ED Visit - Positive Culture Follow-up: Successful Patient Follow-Up  Culture assessed and recommendations reviewed by:  []  Elenor Quinones, Pharm.D. []  Heide Guile, Pharm.D., BCPS AQ-ID []  Parks Neptune, Pharm.D., BCPS []  Alycia Rossetti, Pharm.D., BCPS []  [place], [place].D., BCPS, AAHIVP []  Legrand Como, Pharm.D., BCPS, AAHIVP []  Salome Arnt, PharmD, BCPS []  Johnnette Gourd, PharmD, BCPS []  Hughes Better, PharmD, BCPS [x]  Reuel Boom, PharmD  Positive urine culture  []  Patient discharged without antimicrobial prescription and treatment is now indicated [x]  Organism is resistant to prescribed ED discharge antimicrobial []  Patient with positive blood cultures  Changes discussed with ED provider: Lajean Saver, MD New antibiotic prescription Bactrim DS one tab PO BID x seven days Called to [facility] ([facility] on [address] [phone])  Contacted patient, date 10/03/2019, time [place] 10/03/2019, 2:37 PM
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
## 2019-10-03 NOTE — Progress Notes (Signed)
ED Antimicrobial Stewardship Positive Culture Follow Up   Lindsay Whitney is an 23 y.o. female who presented to [facility] on 09/30/2019 with a chief complaint of  Chief Complaint  Patient presents with  . Flank Pain    Recent Results (from the past 720 hour(s))  Urine culture     Status: Abnormal   Collection Time: 09/30/19 11:48 AM   Specimen: Urine, Clean Catch  Result Value Ref Range Status   Specimen Description   Final    URINE, CLEAN CATCH Performed at [facility], [place] [address] [place], [place] [address]    Special Requests   Final    NONE Performed at [facility], [place] [address] [place], [place] [address]    Culture (A)  Final    80,000 COLONIES/mL ESCHERICHIA COLI Confirmed Extended Spectrum Beta-Lactamase Producer (ESBL).  In bloodstream infections from ESBL organisms, carbapenems are preferred over piperacillin/tazobactam. They are shown to have a lower risk of mortality.    Report Status 10/02/2019 FINAL  Final   Organism ID, Bacteria ESCHERICHIA COLI (A)  Final      Susceptibility   Escherichia coli - MIC*    AMPICILLIN >=32 RESISTANT Resistant     CEFAZOLIN >=64 RESISTANT Resistant     CEFTRIAXONE >=64 RESISTANT Resistant     CIPROFLOXACIN <=0.25 SENSITIVE Sensitive     GENTAMICIN <=1 SENSITIVE Sensitive     IMIPENEM <=0.25 SENSITIVE Sensitive     NITROFURANTOIN <=16 SENSITIVE Sensitive     TRIMETH/SULFA <=20 SENSITIVE Sensitive     AMPICILLIN/SULBACTAM 4 SENSITIVE Sensitive     PIP/TAZO <=4 SENSITIVE Sensitive     Extended ESBL POSITIVE Resistant     * 80,000 COLONIES/mL ESCHERICHIA COLI    [x]  Treated with Keflex, organism resistant to prescribed antimicrobial  New antibiotic prescription: Bactrim DS 1 tab PO BID x 7 days (#14)  ED Provider: Lajean Saver, MD   Mattelyn Imhoff A 10/03/2019, 11:57 AM Clinical Pharmacist [phone]

## 2019-10-03 NOTE — Discharge Instructions (Addendum)
Please discontinue taking the cephalexin/keflex antibiotic. Begin taking the bactrim as directed until completely gone. You have been given your first dose here. You can take zofran every 8 hours for nausea. It is most important that you stay hydrated. You can alternate tylenol and advil every 4 hours for pain and fever. Please return to the ER. Return to the ER if you have uncontrollable vomiting, or if your symptoms do not improve or worsen after the  2 days of this new antibiotic.

## 2019-10-03 NOTE — ED Triage Notes (Signed)
Pt reports she was called and told she was given the wrong antibiotics. Patient states she has had fever, chills, and emesis since Monday.  Patient states she has been "nonfunctional" since Monday night.

## 2019-10-03 NOTE — ED Notes (Signed)
Pt reports back pain when she inhales, describes it as "pressure". Also reports having a fever over the past 3 days after leaving the ED on 11/23. Reported highest temp was 104F. Pt currently afebrile. RN notified.

## 2020-06-25 ENCOUNTER — Ambulatory Visit: Payer: Medicaid Other

## 2020-06-25 ENCOUNTER — Ambulatory Visit (INDEPENDENT_AMBULATORY_CARE_PROVIDER_SITE_OTHER): Payer: Self-pay

## 2020-06-25 ENCOUNTER — Other Ambulatory Visit: Payer: Self-pay

## 2020-06-25 VITALS — BP 127/78 | HR 97 | Ht 61.0 in | Wt 138.4 lb

## 2020-06-25 DIAGNOSIS — Z3687 Encounter for antenatal screening for uncertain dates: Secondary | ICD-10-CM

## 2020-06-25 DIAGNOSIS — O3680X0 Pregnancy with inconclusive fetal viability, not applicable or unspecified: Secondary | ICD-10-CM

## 2020-06-25 DIAGNOSIS — Z3491 Encounter for supervision of normal pregnancy, unspecified, first trimester: Secondary | ICD-10-CM | POA: Insufficient documentation

## 2020-06-25 MED ORDER — BLOOD PRESSURE KIT: 1.0000 | PACK | 0 refills | Status: DC

## 2020-06-25 NOTE — Progress Notes (Signed)
PRENATAL INTAKE SUMMARY  Ms. Keithley presents today New OB Nurse Interview.  OB History    Gravida  2   Para  2   Term  2   Preterm      AB      Living  2     SAB      TAB      Ectopic      Multiple      Live Births  2          I have reviewed the patient's medical, obstetrical, social, and family histories, medications, and available lab results.  SUBJECTIVE She has no unusual complaints  OBJECTIVE Initial Physical Exam (New OB)  GENERAL APPEARANCE: alert, well appearing   ASSESSMENT Normal pregnancy  PLAN Prenatal care to be completed at [facility] All new Ob labs will be completed at New Ob provider visit Baby scripts ordered [facility]. PHQ2 score: 0 U/S today reveals [redacted]w[redacted]d single live IUP. FHR 161 

## 2020-07-03 ENCOUNTER — Other Ambulatory Visit: Payer: Self-pay

## 2020-07-03 ENCOUNTER — Other Ambulatory Visit (HOSPITAL_COMMUNITY)
Admission: RE | Admit: 2020-07-03 | Discharge: 2020-07-03 | Disposition: A | Discharge status: Home / Self Care | Payer: Medicaid Other | Source: Ambulatory Visit | Attending: Obstetrics and Gynecology | Admitting: Obstetrics and Gynecology

## 2020-07-03 ENCOUNTER — Encounter: Payer: Self-pay | Admitting: Obstetrics

## 2020-07-03 ENCOUNTER — Encounter: Payer: Self-pay | Admitting: Obstetrics and Gynecology

## 2020-07-03 ENCOUNTER — Ambulatory Visit (INDEPENDENT_AMBULATORY_CARE_PROVIDER_SITE_OTHER): Payer: Self-pay | Admitting: Obstetrics and Gynecology

## 2020-07-03 VITALS — BP 107/74 | HR 77 | Wt 139.0 lb

## 2020-07-03 DIAGNOSIS — Z3A12 12 weeks gestation of pregnancy: Secondary | ICD-10-CM

## 2020-07-03 DIAGNOSIS — Z3491 Encounter for supervision of normal pregnancy, unspecified, first trimester: Secondary | ICD-10-CM | POA: Insufficient documentation

## 2020-07-03 NOTE — Progress Notes (Signed)
NOB  Planned :Yes   NOB Intake done on 06/25/20. Genetic Screening: Desires and wants to know gender. Currently self pay Pt made aware of cost and given [facility] care form .   Last pap:  Per pt 1 yr ago [facility] WNL no Hx of abnormal pap.   CC: None

## 2020-07-03 NOTE — Patient Instructions (Signed)
First Trimester of Pregnancy The first trimester of pregnancy is from week 1 until the end of week 13 (months 1 through 3). A week after a sperm fertilizes an egg, the egg will implant on the wall of the uterus. This embryo will begin to develop into a baby. Genes from you and your partner will form the baby. The female genes will determine whether the baby will be a boy or a girl. At 6-8 weeks, the eyes and face will be formed, and the heartbeat can be seen on ultrasound. At the end of 12 weeks, all the baby's organs will be formed. Now that you are pregnant, you will want to do everything you can to have a healthy baby. Two of the most important things are to get good prenatal care and to follow your health care provider's instructions. Prenatal care is all the medical care you receive before the baby's birth. This care will help prevent, find, and treat any problems during the pregnancy and childbirth. Body changes during your first trimester Your body goes through many changes during pregnancy. The changes vary from woman to woman.  You may gain or lose a couple of pounds at first.  You may feel sick to your stomach (nauseous) and you may throw up (vomit). If the vomiting is uncontrollable, call your health care provider.  You may tire easily.  You may develop headaches that can be relieved by medicines. All medicines should be approved by your health care provider.  You may urinate more often. Painful urination may mean you have a bladder infection.  You may develop heartburn as a result of your pregnancy.  You may develop constipation because certain hormones are causing the muscles that push stool through your intestines to slow down.  You may develop hemorrhoids or swollen veins (varicose veins).  Your breasts may begin to grow larger and become tender. Your nipples may stick out more, and the tissue that surrounds them (areola) may become darker.  Your gums may bleed and may be  sensitive to brushing and flossing.  Dark spots or blotches (chloasma, mask of pregnancy) may develop on your face. This will likely fade after the baby is born.  Your menstrual periods will stop.  You may have a loss of appetite.  You may develop cravings for certain kinds of food.  You may have changes in your emotions from day to day, such as being excited to be pregnant or being concerned that something may go wrong with the pregnancy and baby.  You may have more vivid and strange dreams.  You may have changes in your hair. These can include thickening of your hair, rapid growth, and changes in texture. Some women also have hair loss during or after pregnancy, or hair that feels dry or thin. Your hair will most likely return to normal after your baby is born. What to expect at prenatal visits During a routine prenatal visit:  You will be weighed to make sure you and the baby are growing normally.  Your blood pressure will be taken.  Your abdomen will be measured to track your baby's growth.  The fetal heartbeat will be listened to between weeks 10 and 14 of your pregnancy.  Test results from any previous visits will be discussed. Your health care provider may ask you:  How you are feeling.  If you are feeling the baby move.  If you have had any abnormal symptoms, such as leaking fluid, bleeding, severe headaches, or abdominal   cramping.  If you are using any tobacco products, including cigarettes, chewing tobacco, and electronic cigarettes.  If you have any questions. Other tests that may be performed during your first trimester include:  Blood tests to find your blood type and to check for the presence of any previous infections. The tests will also be used to check for low iron levels (anemia) and protein on red blood cells (Rh antibodies). Depending on your risk factors, or if you previously had diabetes during pregnancy, you may have tests to check for high blood sugar  that affects pregnant women (gestational diabetes).  Urine tests to check for infections, diabetes, or protein in the urine.  An ultrasound to confirm the proper growth and development of the baby.  Fetal screens for spinal cord problems (spina bifida) and Down syndrome.  HIV (human immunodeficiency virus) testing. Routine prenatal testing includes screening for HIV, unless you choose not to have this test.  You may need other tests to make sure you and the baby are doing well. Follow these instructions at home: Medicines  Follow your health care provider's instructions regarding medicine use. Specific medicines may be either safe or unsafe to take during pregnancy.  Take a prenatal vitamin that contains at least 600 micrograms (mcg) of folic acid.  If you develop constipation, try taking a stool softener if your health care provider approves. Eating and drinking   Eat a balanced diet that includes fresh fruits and vegetables, whole grains, good sources of protein such as meat, eggs, or tofu, and low-fat dairy. Your health care provider will help you determine the amount of weight gain that is right for you.  Avoid raw meat and uncooked cheese. These carry germs that can cause birth defects in the baby.  Eating four or five small meals rather than three large meals a day may help relieve nausea and vomiting. If you start to feel nauseous, eating a few soda crackers can be helpful. Drinking liquids between meals, instead of during meals, also seems to help ease nausea and vomiting.  Limit foods that are high in fat and processed sugars, such as fried and sweet foods.  To prevent constipation: ? Eat foods that are high in fiber, such as fresh fruits and vegetables, whole grains, and beans. ? Drink enough fluid to keep your urine clear or pale yellow. Activity  Exercise only as directed by your health care provider. Most women can continue their usual exercise routine during  pregnancy. Try to exercise for 30 minutes at least 5 days a week. Exercising will help you: ? Control your weight. ? Stay in shape. ? Be prepared for labor and delivery.  Experiencing pain or cramping in the lower abdomen or lower back is a good sign that you should stop exercising. Check with your health care provider before continuing with normal exercises.  Try to avoid standing for long periods of time. Move your legs often if you must stand in one place for a long time.  Avoid heavy lifting.  Wear low-heeled shoes and practice good posture.  You may continue to have sex unless your health care provider tells you not to. Relieving pain and discomfort  Wear a good support bra to relieve breast tenderness.  Take warm sitz baths to soothe any pain or discomfort caused by hemorrhoids. Use hemorrhoid cream if your health care provider approves.  Rest with your legs elevated if you have leg cramps or low back pain.  If you develop varicose veins in   your legs, wear support hose. Elevate your feet for 15 minutes, 3-4 times a day. Limit salt in your diet. Prenatal care  Schedule your prenatal visits by the twelfth week of pregnancy. They are usually scheduled monthly at first, then more often in the last 2 months before delivery.  Write down your questions. Take them to your prenatal visits.  Keep all your prenatal visits as told by your health care provider. This is important. Safety  Wear your seat belt at all times when driving.  Make a list of emergency phone numbers, including numbers for family, friends, the hospital, and police and fire departments. General instructions  Ask your health care provider for a referral to a local prenatal education class. Begin classes no later than the beginning of month 6 of your pregnancy.  Ask for help if you have counseling or nutritional needs during pregnancy. Your health care provider can offer advice or refer you to specialists for help  with various needs.  Do not use hot tubs, steam rooms, or saunas.  Do not douche or use tampons or scented sanitary pads.  Do not cross your legs for long periods of time.  Avoid cat litter boxes and soil used by cats. These carry germs that can cause birth defects in the baby and possibly loss of the fetus by miscarriage or stillbirth.  Avoid all smoking, herbs, alcohol, and medicines not prescribed by your health care provider. Chemicals in these products affect the formation and growth of the baby.  Do not use any products that contain nicotine or tobacco, such as cigarettes and e-cigarettes. If you need help quitting, ask your health care provider. You may receive counseling support and other resources to help you quit.  Schedule a dentist appointment. At home, brush your teeth with a soft toothbrush and be gentle when you floss. Contact a health care provider if:  You have dizziness.  You have mild pelvic cramps, pelvic pressure, or nagging pain in the abdominal area.  You have persistent nausea, vomiting, or diarrhea.  You have a bad smelling vaginal discharge.  You have pain when you urinate.  You notice increased swelling in your face, hands, legs, or ankles.  You are exposed to fifth disease or chickenpox.  You are exposed to German measles (rubella) and have never had it. Get help right away if:  You have a fever.  You are leaking fluid from your vagina.  You have spotting or bleeding from your vagina.  You have severe abdominal cramping or pain.  You have rapid weight gain or loss.  You vomit blood or material that looks like coffee grounds.  You develop a severe headache.  You have shortness of breath.  You have any kind of trauma, such as from a fall or a car accident. Summary  The first trimester of pregnancy is from week 1 until the end of week 13 (months 1 through 3).  Your body goes through many changes during pregnancy. The changes vary from  woman to woman.  You will have routine prenatal visits. During those visits, your health care provider will examine you, discuss any test results you may have, and talk with you about how you are feeling. This information is not intended to replace advice given to you by your health care provider. Make sure you discuss any questions you have with your health care provider. Document Revised: 10/06/2017 Document Reviewed: 10/05/2016 Elsevier Patient Education  2020 [employer]  

## 2020-07-04 LAB — CBC/D/PLT+RPR+RH+ABO+RUB AB...
Antibody Screen: NEGATIVE
Basophils Absolute: 0 10*3/uL (ref 0.0–0.2)
Basos: 0 %
EOS (ABSOLUTE): 0.1 10*3/uL (ref 0.0–0.4)
Eos: 2 %
HCV Ab: <0.1 s/co ratio (ref 0.0–0.9)
HIV Screen 4th Generation wRfx: NONREACTIVE
Hematocrit: 35.4 % (ref 34.0–46.6)
Hemoglobin: 12.1 g/dL (ref 11.1–15.9)
Hepatitis B Surface Ag: NEGATIVE
Immature Grans (Abs): 0 10*3/uL (ref 0.0–0.1)
Immature Granulocytes: 0 %
Lymphocytes Absolute: 2.1 10*3/uL (ref 0.7–3.1)
Lymphs: 34 %
MCH: 30.8 pg (ref 26.6–33.0)
MCHC: 34.2 g/dL (ref 31.5–35.7)
MCV: 90 fL (ref 79–97)
Monocytes Absolute: 0.4 10*3/uL (ref 0.1–0.9)
Monocytes: 7 %
Neutrophils Absolute: 3.6 10*3/uL (ref 1.4–7.0)
Neutrophils: 57 %
Platelets: 182 10*3/uL (ref 150–450)
RBC: 3.93 x10E6/uL (ref 3.77–5.28)
RDW: 12.5 % (ref 11.7–15.4)
RPR Ser Ql: NONREACTIVE
Rh Factor: POSITIVE
Rubella Antibodies, IGG: 2.27 index (ref >0.99)
WBC: 6.2 10*3/uL (ref 3.4–10.8)

## 2020-07-04 LAB — HCV INTERPRETATION

## 2020-07-05 ENCOUNTER — Encounter: Payer: Self-pay | Admitting: Obstetrics and Gynecology

## 2020-07-05 NOTE — Progress Notes (Signed)
INITIAL OBSTETRICAL VISIT Patient name: Lindsay Whitney MRN [id number]  Date of birth: [birth year] Chief Complaint:   Initial Prenatal Visit  History of Present Illness:   Lindsay Whitney is a 24 y.o. G23P2012 Caucasian female at [redacted]w[redacted]d by 11.5 wks U/S with an Estimated Date of Delivery: 01/09/21 being seen today for her initial obstetrical visit.  Her obstetrical history is significant for: SVD at 39 wks with [facility] (no complications, weight 6 lbs 7 oz in 2014), SVD at 39 wks with [facility] (no complications, weight 7 lbs 14 oz in 2015) and  SAB in early pregnancy in 03/2019; no complications. This is a planned pregnancy. She and the father of the baby (FOB)/spouse "[name]" live together. 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 She has a support system that consists of her spouse/their other children/other family/friends. Today she reports reports nausea is getting better and is tolerable.   Patient's last menstrual period was 04/04/2020. Last pap 1 year ago at [facility]. Results were: normal Review of Systems:   Pertinent items are noted in HPI Denies cramping/contractions, leakage of fluid, vaginal bleeding, abnormal vaginal discharge w/ itching/odor/irritation, headaches, visual changes, shortness of breath, chest pain, abdominal pain, severe nausea/vomiting, or problems with urination or bowel movements unless otherwise stated above.  Pertinent History Reviewed:  Reviewed past medical,surgical, social, obstetrical and family history.  Reviewed problem list, medications and allergies. OB History  Gravida Para Term Preterm AB Living  4 2 2   1 2   SAB TAB Ectopic Multiple Live Births  1       2    # Outcome Date GA Lbr Len/2nd Weight Sex Delivery Anes PTL Lv  4 Current           3 SAB 03/2019          2 Term 07/10/14 [redacted]w[redacted]d 19:11 / 00:44 7 lb 13.2 oz (3.55 kg) M Vag-Spont EPI  LIV  1 Term 04/20/13 [redacted]w[redacted]d 38:40 / 00:31 6 lb 7.2 oz (2.925 kg) F Vag-Spont Local, EPI  LIV    Physical Assessment:   Vitals:   07/03/20 0937  BP: 107/74  Pulse: 77  Weight: 139 lb (63 kg)  Body mass index is 26.26 kg/m.       Physical Examination:  General appearance - well appearing, and in no distress  Mental status - alert, oriented to person, place, and time  Psych:  She has a normal mood and affect  Skin - warm and dry, normal color, no suspicious lesions noted  Chest - effort normal, all lung fields clear to auscultation bilaterally  Heart - normal rate and regular rhythm  Abdomen - soft, nontender  Extremities:  No swelling or varicosities noted  Pelvic - VULVA: normal appearing vulva with no masses, tenderness or lesions  VAGINA: normal appearing vagina with normal color and discharge, no lesions.   CERVIX: normal appearing cervix without discharge or lesions, no CMT  Thin prep pap is not done    FHTs by doppler: 163 bpm  Assessment & Plan:  1) Low-Risk Pregnancy 07/05/20 at [redacted]w[redacted]d with an Estimated Date of Delivery: 01/09/21   2) Initial OB visit - Welcomed to practice and introduced self to patient in addition to discussing other advanced practice providers that she may be seeing at this practice - Congratulated patient - Anticipatory guidance on upcoming appointments - Educated on COVID19 and pregnancy and the integration of virtual appointments  - Educated on babyscripts app- patient reports she has  not received email, encouraged to look in spam folder and to call office if she still has not received email - patient verbalizes understanding    3) Encounter for supervision of normal pregnancy in first trimester, unspecified gravidity - CBC/D/Plt+RPR+Rh+ABO+Rub Ab... - Culture, OB Urine - Cervicovaginal ancillary only( [place]) - Cytology - PAP - Genetic Screening - US MFM OB COMP + 14 WK; Future  4) [redacted] weeks gestation of pregnancy    Meds: No orders of the defined types were placed in this encounter.   Initial labs obtained Continue prenatal  vitamins Reviewed n/v relief measures and warning s/s to report Reviewed recommended weight gain based on pre-gravid BMI Encouraged well-balanced diet Genetic Screening discussed: ordered Cystic fibrosis, SMA, Fragile X screening discussed ordered The nature of [facility] with multiple MDs and other Advanced Practice Providers was explained to patient; also emphasized that residents, students are part of our team.  Discussed optimized OB schedule and video visits. Advised can have an in-office visit whenever she feels she needs to be seen.  Does not have own BP cuff. Explained to patient that BP will be mailed to her house. Check BP weekly, let us know if >140/90. Advised to call during normal business hours and there is an after-hours nurse line available.    Follow-up: Return in about 6 weeks (around 08/14/2020) for Return OB - My Chart video.   Orders Placed This Encounter  Procedures  . Culture, OB Urine  . [place] MFM OB COMP + 14 WK  . CBC/D/Plt+RPR+Rh+ABO+Rub Ab...  . Genetic Screening  . Interpretation:    Raelyn Mora MSN, CNM 07/03/2020

## 2020-07-06 LAB — CERVICOVAGINAL ANCILLARY ONLY
Bacterial Vaginitis (gardnerella): NEGATIVE
Candida Glabrata: NEGATIVE
Candida Vaginitis: NEGATIVE
Chlamydia: NEGATIVE
Comment: NEGATIVE
Comment: NEGATIVE
Comment: NEGATIVE
Comment: NEGATIVE
Comment: NEGATIVE
Comment: NORMAL
Neisseria Gonorrhea: NEGATIVE
Trichomonas: NEGATIVE

## 2020-07-06 LAB — CULTURE, OB URINE

## 2020-07-06 LAB — URINE CULTURE, OB REFLEX

## 2020-07-06 NOTE — Addendum Note (Signed)
Addended by: Marya Landry D on: 07/06/2020 02:38 PM   Modules accepted: Orders

## 2020-07-10 ENCOUNTER — Encounter: Payer: Self-pay | Admitting: Obstetrics and Gynecology

## 2020-07-20 ENCOUNTER — Encounter: Payer: Self-pay | Admitting: Obstetrics and Gynecology

## 2020-07-20 ENCOUNTER — Telehealth: Payer: Self-pay

## 2020-07-20 DIAGNOSIS — Z148 Genetic carrier of other disease: Secondary | ICD-10-CM | POA: Insufficient documentation

## 2020-07-20 DIAGNOSIS — Z3491 Encounter for supervision of normal pregnancy, unspecified, first trimester: Secondary | ICD-10-CM

## 2020-07-20 NOTE — Telephone Encounter (Signed)
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 Patient is being referred to genetic counseling for further information and partner testing. Order in epic

## 2020-07-20 NOTE — Telephone Encounter (Signed)
Call patient to discuss her lab results and the need to be seen by genetic counseling for positive carrier muscular dystrophy.No answer and voice mail did not pick up.

## 2020-08-03 ENCOUNTER — Encounter (HOSPITAL_COMMUNITY): Payer: Self-pay | Admitting: Obstetrics and Gynecology

## 2020-08-03 ENCOUNTER — Inpatient Hospital Stay (HOSPITAL_COMMUNITY): Payer: Medicaid Other

## 2020-08-03 ENCOUNTER — Inpatient Hospital Stay (HOSPITAL_COMMUNITY)
Admission: AD | Admit: 2020-08-03 | Discharge: 2020-08-03 | Disposition: A | Discharge status: Home / Self Care | Payer: Medicaid Other | Attending: Obstetrics and Gynecology | Admitting: Obstetrics and Gynecology

## 2020-08-03 ENCOUNTER — Other Ambulatory Visit: Payer: Self-pay

## 2020-08-03 DIAGNOSIS — R1012 Left upper quadrant pain: Secondary | ICD-10-CM | POA: Diagnosis not present

## 2020-08-03 DIAGNOSIS — O26892 Other specified pregnancy related conditions, second trimester: Secondary | ICD-10-CM | POA: Diagnosis not present

## 2020-08-03 DIAGNOSIS — R141 Gas pain: Secondary | ICD-10-CM | POA: Diagnosis not present

## 2020-08-03 DIAGNOSIS — Z87442 Personal history of urinary calculi: Secondary | ICD-10-CM | POA: Diagnosis not present

## 2020-08-03 DIAGNOSIS — Z3A17 17 weeks gestation of pregnancy: Secondary | ICD-10-CM

## 2020-08-03 DIAGNOSIS — O99891 Other specified diseases and conditions complicating pregnancy: Secondary | ICD-10-CM

## 2020-08-03 DIAGNOSIS — Z3491 Encounter for supervision of normal pregnancy, unspecified, first trimester: Secondary | ICD-10-CM

## 2020-08-03 LAB — COMPREHENSIVE METABOLIC PANEL
ALT: 18 U/L (ref 0–44)
AST: 16 U/L (ref 15–41)
Albumin: 3.4 g/dL — ABNORMAL LOW (ref 3.5–5.0)
Alkaline Phosphatase: 49 U/L (ref 38–126)
Anion gap: 9 (ref 5–15)
BUN: 7 mg/dL (ref 6–20)
CO2: 22 mmol/L (ref 22–32)
Calcium: 9.4 mg/dL (ref 8.9–10.3)
Chloride: 105 mmol/L (ref 98–111)
Creatinine, Ser: 0.46 mg/dL (ref 0.44–1.00)
GFR calc Af Amer: >60 mL/min (ref >60)
GFR calc non Af Amer: >60 mL/min (ref >60)
Glucose, Bld: 83 mg/dL (ref 70–99)
Potassium: 3.7 mmol/L (ref 3.5–5.1)
Sodium: 136 mmol/L (ref 135–145)
Total Bilirubin: 0.8 mg/dL (ref 0.3–1.2)
Total Protein: 6.5 g/dL (ref 6.5–8.1)

## 2020-08-03 LAB — CBC
HCT: 31.4 % — ABNORMAL LOW (ref 36.0–46.0)
Hemoglobin: 10.8 g/dL — ABNORMAL LOW (ref 12.0–15.0)
MCH: 30.1 pg (ref 26.0–34.0)
MCHC: 34.4 g/dL (ref 30.0–36.0)
MCV: 87.5 fL (ref 80.0–100.0)
Platelets: 155 10*3/uL (ref 150–400)
RBC: 3.59 MIL/uL — ABNORMAL LOW (ref 3.87–5.11)
RDW: 12.4 % (ref 11.5–15.5)
WBC: 6.6 10*3/uL (ref 4.0–10.5)
nRBC: 0 % (ref 0.0–0.2)

## 2020-08-03 LAB — URINALYSIS, ROUTINE W REFLEX MICROSCOPIC
Bilirubin Urine: NEGATIVE
Glucose, UA: NEGATIVE mg/dL
Hgb urine dipstick: NEGATIVE
Ketones, ur: NEGATIVE mg/dL
Nitrite: NEGATIVE
Protein, ur: NEGATIVE mg/dL
Specific Gravity, Urine: 1.003 — ABNORMAL LOW (ref 1.005–1.030)
pH: 8 (ref 5.0–8.0)

## 2020-08-03 LAB — LIPASE, BLOOD: Lipase: 39 U/L (ref 11–51)

## 2020-08-03 MED ORDER — CYCLOBENZAPRINE HCL 5 MG PO TABS
10.0000 mg | ORAL_TABLET | Freq: Once | ORAL | Status: AC
  Administered 2020-08-03: 10 mg via ORAL
  Filled 2020-08-03: qty 2

## 2020-08-03 MED ORDER — SIMETHICONE 80 MG PO CHEW
80.0000 mg | CHEWABLE_TABLET | Freq: Once | ORAL | Status: AC
  Administered 2020-08-03: 80 mg via ORAL
  Filled 2020-08-03: qty 1

## 2020-08-03 MED ORDER — SIMETHICONE 80 MG PO CHEW: 80.0000 mg | CHEWABLE_TABLET | Freq: Four times a day (QID) | ORAL | 0 refills | Status: DC | PRN

## 2020-08-03 MED ORDER — OXYCODONE HCL 5 MG PO TABS: 10.0000 mg | ORAL_TABLET | Freq: Once | ORAL | Status: DC

## 2020-08-03 NOTE — Discharge Instructions (Signed)
Chiropractic Care Lindsay Whitney with [facility] at [email or web address]   Abdominal Bloating When you have abdominal bloating, your abdomen may feel full, tight, or painful. It may also look bigger than normal or swollen (distended). Common causes of abdominal bloating include:  Swallowing air.  Constipation.  Problems digesting food.  Eating too much.  Irritable bowel syndrome. This is a condition that affects the large intestine.  Lactose intolerance. This is an inability to digest lactose, a natural sugar in dairy products.  Celiac disease. This is a condition that affects the ability to digest gluten, a protein found in some grains.  Gastroparesis. This is a condition that slows down the movement of food in the stomach and small intestine. It is more common in people with diabetes mellitus.  Gastroesophageal reflux disease (GERD). This is a digestive condition that makes stomach acid flow back into the esophagus.  Urinary retention. This means that the body is holding onto urine, and the bladder cannot be emptied all the way. Follow these instructions at home: Eating and drinking  Avoid eating too much.  Try not to swallow air while talking or eating.  Avoid eating while lying down.  Avoid these foods and drinks: ? Foods that cause gas, such as broccoli, cabbage, cauliflower, and baked beans. ? Carbonated drinks. ? Hard candy. ? Chewing gum. Medicines  Take over-the-counter and prescription medicines only as told by your health care provider.  Take probiotic medicines. These medicines contain live bacteria or yeasts that can help digestion.  Take coated peppermint oil capsules. Activity  Try to exercise regularly. Exercise may help to relieve bloating that is caused by gas and relieve constipation. General instructions  Keep all follow-up visits as told by your health care provider. This is  important. Contact a health care provider if:  You have nausea and vomiting.  You have diarrhea.  You have abdominal pain.  You have unusual weight loss or weight gain.  You have severe pain, and medicines do not help. Get help right away if:  You have severe chest pain.  You have trouble breathing.  You have shortness of breath.  You have trouble urinating.  You have darker urine than normal.  You have blood in your stools or have dark, tarry stools. Summary  Abdominal bloating means that the abdomen is swollen.  Common causes of abdominal bloating are swallowing air, constipation, and problems digesting food.  Avoid eating too much and avoid swallowing air.  Avoid foods that cause gas, carbonated drinks, hard candy, and chewing gum. This information is not intended to replace advice given to you by your health care provider. Make sure you discuss any questions you have with your health care provider. Document Revised: 02/11/2019 Document Reviewed: 11/25/2016 Elsevier Patient Education  2020 [employer].

## 2020-08-03 NOTE — MAU Note (Signed)
.   Lindsay Whitney is a 24 y.o. at [redacted]w[redacted]d here in MAU reporting: left sided abdominal pain that started last week. She states that is sharp under her left breast and radiates to the left side of her back. The pain is gradually getting worse. Has hx of kidney stones and kidney infections. No VB or LOF.   Pain score: 7 Vitals:   08/03/20 1043  BP: 116/68  Pulse: 87  Resp: 15  Temp: 97.9 F (36.6 C)  SpO2: 100%     FHT:160 Lab orders placed from triage: UA

## 2020-08-03 NOTE — MAU Provider Note (Signed)
History     CSN: [id number]  Arrival date and time: 08/03/20 1028   First Provider Initiated Contact with Patient 08/03/20 1107      Chief Complaint  Patient presents with  . Abdominal Pain   HPI: Lindsay Whitney is a 24 y.o. [phone] female that presents to the MAU at 60w2dwith complaints of LUQ abdominal pain that radiates to the left flank. States the pain is an 8/10 at baseline, increases to a 10/10 when laying down, and only improves with applying pressure to her LUQ. No CVA tenderness. She has not taken anything for the pain. Denies changes in urinary frequency, color, or pain with urination. States she did not notice these changes last time she had pyelonephritis either. Denies changes in diet, appetite, or bowel habits. States she did not have issues in her past pregnancies.   OB History    Gravida  4   Para  2   Term  2   Preterm      AB  1   Living  2     SAB  1   TAB      Ectopic      Multiple      Live Births  2           Past Medical History:  Diagnosis Date  . Kidney stones 2013  . Pyelonephritis 2013  . UTI (lower urinary tract infection)     Past Surgical History:  Procedure Laterality Date  . NO PAST SURGERIES      Family History  Problem Relation Age of Onset  . Diabetes Maternal Grandmother   . Diabetes Maternal Grandfather     Social History   Tobacco Use  . Smoking status: Never Smoker  . Smokeless tobacco: Never Used  Vaping Use  . Vaping Use: Never used  Substance Use Topics  . Alcohol use: Not Currently    Comment: stopped after confimred pregnancy  . Drug use: No    Allergies: No Known Allergies  Medications Prior to Admission  Medication Sig Dispense Refill Last Dose  . Prenatal Vit-Min-FA-Fish Oil ([facility] PRENATAL GUMMY PO) Take 2 tablets by mouth daily.   08/03/2020 at Unknown time  . Blood Pressure KIT 1 kit by Does not apply route once a week. (Patient not taking: Reported on 07/03/2020) 1 kit 0     Review of  Systems  Constitutional: Negative.   Respiratory: Negative.   Gastrointestinal: Positive for abdominal pain (LUQ). Negative for constipation, diarrhea, nausea and vomiting.  Endocrine: Negative.   Genitourinary: Positive for flank pain (Left). Negative for decreased urine volume, difficulty urinating, dysuria, frequency, pelvic pain and urgency.  Skin: Negative.   Neurological: Negative.    Physical Exam   Blood pressure 116/68, pulse 87, temperature 97.9 F (36.6 C), temperature source Oral, resp. rate 15, height _0  (1.549 m), weight 64.9 kg, last menstrual period 04/04/2020, SpO2 100 %, unknown if currently breastfeeding.  Physical Exam Vitals reviewed. Exam conducted with a chaperone present.  Constitutional:      Appearance: She is well-developed and normal weight.  Cardiovascular:     Rate and Rhythm: Normal rate and regular rhythm.     Heart sounds: Normal heart sounds. No murmur heard.   Pulmonary:     Effort: Pulmonary effort is normal.     Breath sounds: Normal breath sounds. No wheezing.  Abdominal:     General: Bowel sounds are normal.     Palpations: Abdomen is soft.  Tenderness: There is abdominal tenderness (Improved with palpation) in the left upper quadrant. There is no right CVA tenderness, left CVA tenderness, guarding or rebound.  Neurological:     Mental Status: She is alert.     Results for orders placed or performed during the hospital encounter of 08/03/20 (from the past 24 hour(s))  Urinalysis, Routine w reflex microscopic Urine, Clean Catch     Status: Abnormal   Collection Time: 08/03/20 11:23 AM  Result Value Ref Range   Color, Urine COLORLESS (A) YELLOW   APPearance CLEAR CLEAR   Specific Gravity, Urine 1.003 (L) 1.005 - 1.030   pH 8.0 5.0 - 8.0   Glucose, UA NEGATIVE NEGATIVE mg/dL   Hgb urine dipstick NEGATIVE NEGATIVE   Bilirubin Urine NEGATIVE NEGATIVE   Ketones, ur NEGATIVE NEGATIVE mg/dL   Protein, ur NEGATIVE NEGATIVE mg/dL    Nitrite NEGATIVE NEGATIVE   Leukocytes,Ua TRACE (A) NEGATIVE   RBC / HPF 0-5 0 - 5 RBC/hpf   WBC, UA 0-5 0 - 5 WBC/hpf   Bacteria, UA RARE (A) NONE SEEN   Squamous Epithelial / LPF 0-5 0 - 5  CBC     Status: Abnormal   Collection Time: 08/03/20 11:32 AM  Result Value Ref Range   WBC 6.6 4.0 - 10.5 K/uL   RBC 3.59 (L) 3.87 - 5.11 MIL/uL   Hemoglobin 10.8 (L) 12.0 - 15.0 g/dL   HCT 31.4 (L) 36 - 46 %   MCV 87.5 80.0 - 100.0 fL   MCH 30.1 26.0 - 34.0 pg   MCHC 34.4 30.0 - 36.0 g/dL   RDW 12.4 11.5 - 15.5 %   Platelets 155 150 - 400 K/uL   nRBC 0.0 0.0 - 0.2 %  Comprehensive metabolic panel     Status: Abnormal   Collection Time: 08/03/20 11:32 AM  Result Value Ref Range   Sodium 136 135 - 145 mmol/L   Potassium 3.7 3.5 - 5.1 mmol/L   Chloride 105 98 - 111 mmol/L   CO2 22 22 - 32 mmol/L   Glucose, Bld 83 70 - 99 mg/dL   BUN 7 6 - 20 mg/dL   Creatinine, Ser 0.46 0.44 - 1.00 mg/dL   Calcium 9.4 8.9 - 10.3 mg/dL   Total Protein 6.5 6.5 - 8.1 g/dL   Albumin 3.4 (L) 3.5 - 5.0 g/dL   AST 16 15 - 41 U/L   ALT 18 0 - 44 U/L   Alkaline Phosphatase 49 38 - 126 U/L   Total Bilirubin 0.8 0.3 - 1.2 mg/dL   GFR calc non Af Amer >60 >60 mL/min   GFR calc Af Amer >60 >60 mL/min   Anion gap 9 5 - 15  Lipase, blood     Status: None   Collection Time: 08/03/20 11:32 AM  Result Value Ref Range   Lipase 39 11 - 51 U/L     MAU Course  Procedures  MDM -Workup for pyelonephritis due to LUQ and L flank pain:   -CBC, UA  -No CVA tenderness, no acute abdomen -LUQ abdominal pain:   -US renal   -CMP, lipase  -Flexeril given for 8/10 pain -Simethicone, 80 mg for gas relief   Assessment and Plan  24 y.o. [id number] female at 25w2dwith complaints of LUQ and L flank pain.  #Musculoskeletal pain: WBC WNL, no fever, no CVA tenderness, renal ultrasound showed no hydronephrosis or calculi. Start flexeril, 10 mg. Simethicone, 80 mg for gas relief.   Lindsay Whitney L  [name] 08/03/2020, 1:20 PM

## 2020-08-04 LAB — CULTURE, OB URINE: Culture: NO GROWTH

## 2020-08-14 ENCOUNTER — Telehealth (INDEPENDENT_AMBULATORY_CARE_PROVIDER_SITE_OTHER): Payer: Medicaid Other

## 2020-08-14 DIAGNOSIS — O99282 Endocrine, nutritional and metabolic diseases complicating pregnancy, second trimester: Secondary | ICD-10-CM

## 2020-08-14 DIAGNOSIS — D696 Thrombocytopenia, unspecified: Secondary | ICD-10-CM

## 2020-08-14 DIAGNOSIS — Z62810 Personal history of physical and sexual abuse in childhood: Secondary | ICD-10-CM

## 2020-08-14 DIAGNOSIS — Z3491 Encounter for supervision of normal pregnancy, unspecified, first trimester: Secondary | ICD-10-CM

## 2020-08-14 DIAGNOSIS — Z87442 Personal history of urinary calculi: Secondary | ICD-10-CM

## 2020-08-14 DIAGNOSIS — O99012 Anemia complicating pregnancy, second trimester: Secondary | ICD-10-CM

## 2020-08-14 DIAGNOSIS — Z148 Genetic carrier of other disease: Secondary | ICD-10-CM

## 2020-08-14 DIAGNOSIS — Z3A18 18 weeks gestation of pregnancy: Secondary | ICD-10-CM

## 2020-08-14 NOTE — Progress Notes (Signed)
Patient unable to take blood pressure at this time.  Informed patient of genetic screening results.
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
## 2020-08-14 NOTE — Patient Instructions (Signed)
Contraception Choices Contraception, also called birth control, refers to methods or devices that prevent pregnancy. Hormonal methods Contraceptive implant  A contraceptive implant is a thin, plastic tube that contains a hormone. It is inserted into the upper part of the arm. It can remain in place for up to 3 years. Progestin-only injections Progestin-only injections are injections of progestin, a synthetic form of the hormone progesterone. They are given every 3 months by a health care provider. Birth control pills  Birth control pills are pills that contain hormones that prevent pregnancy. They must be taken once a day, preferably at the same time each day. Birth control patch  The birth control patch contains hormones that prevent pregnancy. It is placed on the skin and must be changed once a week for three weeks and removed on the fourth week. A prescription is needed to use this method of contraception. Vaginal ring  A vaginal ring contains hormones that prevent pregnancy. It is placed in the vagina for three weeks and removed on the fourth week. After that, the process is repeated with a new ring. A prescription is needed to use this method of contraception. Emergency contraceptive Emergency contraceptives prevent pregnancy after unprotected sex. They come in pill form and can be taken up to 5 days after sex. They work best the sooner they are taken after having sex. Most emergency contraceptives are available without a prescription. This method should not be used as your only form of birth control. Barrier methods Female condom  A female condom is a thin sheath that is worn over the penis during sex. Condoms keep sperm from going inside a woman's body. They can be used with a spermicide to increase their effectiveness. They should be disposed after a single use. Female condom  A female condom is a soft, loose-fitting sheath that is put into the vagina before sex. The condom keeps sperm  from going inside a woman's body. They should be disposed after a single use. Diaphragm  A diaphragm is a soft, dome-shaped barrier. It is inserted into the vagina before sex, along with a spermicide. The diaphragm blocks sperm from entering the uterus, and the spermicide kills sperm. A diaphragm should be left in the vagina for 6-8 hours after sex and removed within 24 hours. A diaphragm is prescribed and fitted by a health care provider. A diaphragm should be replaced every 1-2 years, after giving birth, after gaining more than 15 lb (6.8 kg), and after pelvic surgery. Cervical cap  A cervical cap is a round, soft latex or plastic cup that fits over the cervix. It is inserted into the vagina before sex, along with spermicide. It blocks sperm from entering the uterus. The cap should be left in place for 6-8 hours after sex and removed within 48 hours. A cervical cap must be prescribed and fitted by a health care provider. It should be replaced every 2 years. Sponge  A sponge is a soft, circular piece of polyurethane foam with spermicide on it. The sponge helps block sperm from entering the uterus, and the spermicide kills sperm. To use it, you make it wet and then insert it into the vagina. It should be inserted before sex, left in for at least 6 hours after sex, and removed and thrown away within 30 hours. Spermicides Spermicides are chemicals that kill or block sperm from entering the cervix and uterus. They can come as a cream, jelly, suppository, foam, or tablet. A spermicide should be inserted into the   vagina with an applicator at least 10-15 minutes before sex to allow time for it to work. The process must be repeated every time you have sex. Spermicides do not require a prescription. Intrauterine contraception Intrauterine device (IUD) An IUD is a T-shaped device that is put in a woman's uterus. There are two types:  Hormone IUD.This type contains progestin, a synthetic form of the hormone  progesterone. This type can stay in place for 3-5 years.  Copper IUD.This type is wrapped in copper wire. It can stay in place for 10 years.  Permanent methods of contraception Female tubal ligation In this method, a woman's fallopian tubes are sealed, tied, or blocked during surgery to prevent eggs from traveling to the uterus. Hysteroscopic sterilization In this method, a small, flexible insert is placed into each fallopian tube. The inserts cause scar tissue to form in the fallopian tubes and block them, so sperm cannot reach an egg. The procedure takes about 3 months to be effective. Another form of birth control must be used during those 3 months. Female sterilization This is a procedure to tie off the tubes that carry sperm (vasectomy). After the procedure, the man can still ejaculate fluid (semen). Natural planning methods Natural family planning In this method, a couple does not have sex on days when the woman could become pregnant. Calendar method This means keeping track of the length of each menstrual cycle, identifying the days when pregnancy can happen, and not having sex on those days. Ovulation method In this method, a couple avoids sex during ovulation. Symptothermal method This method involves not having sex during ovulation. The woman typically checks for ovulation by watching changes in her temperature and in the consistency of cervical mucus. Post-ovulation method In this method, a couple waits to have sex until after ovulation. Summary  Contraception, also called birth control, means methods or devices that prevent pregnancy.  Hormonal methods of contraception include implants, injections, pills, patches, vaginal rings, and emergency contraceptives.  Barrier methods of contraception can include female condoms, female condoms, diaphragms, cervical caps, sponges, and spermicides.  There are two types of IUDs (intrauterine devices). An IUD can be put in a woman's uterus to  prevent pregnancy for 3-5 years.  Permanent sterilization can be done through a procedure for males, females, or both.  Natural family planning methods involve not having sex on days when the woman could become pregnant. This information is not intended to replace advice given to you by your health care provider. Make sure you discuss any questions you have with your health care provider. Document Revised: 10/26/2017 Document Reviewed: 11/26/2016 Elsevier Patient Education  2020 Elsevier Inc.  

## 2020-08-21 ENCOUNTER — Ambulatory Visit: Payer: Medicaid Other | Attending: Obstetrics and Gynecology

## 2020-08-21 ENCOUNTER — Other Ambulatory Visit: Payer: Self-pay | Admitting: Obstetrics and Gynecology

## 2020-08-21 ENCOUNTER — Other Ambulatory Visit: Payer: Self-pay

## 2020-08-21 DIAGNOSIS — Z148 Genetic carrier of other disease: Secondary | ICD-10-CM | POA: Diagnosis not present

## 2020-08-21 DIAGNOSIS — Z3A19 19 weeks gestation of pregnancy: Secondary | ICD-10-CM

## 2020-08-21 DIAGNOSIS — Z3491 Encounter for supervision of normal pregnancy, unspecified, first trimester: Secondary | ICD-10-CM | POA: Insufficient documentation

## 2020-08-21 DIAGNOSIS — Z363 Encounter for antenatal screening for malformations: Secondary | ICD-10-CM | POA: Diagnosis not present

## 2020-08-27 ENCOUNTER — Ambulatory Visit: Payer: Medicaid Other | Attending: Obstetrics and Gynecology | Admitting: Genetic Counselor

## 2020-08-27 ENCOUNTER — Ambulatory Visit: Payer: Self-pay | Admitting: Genetic Counselor

## 2020-08-27 ENCOUNTER — Other Ambulatory Visit: Payer: Self-pay

## 2020-08-27 DIAGNOSIS — Z148 Genetic carrier of other disease: Secondary | ICD-10-CM | POA: Diagnosis not present

## 2020-08-27 DIAGNOSIS — Z315 Encounter for genetic counseling: Secondary | ICD-10-CM

## 2020-08-27 NOTE — Progress Notes (Signed)
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 Lindsay Whitney was accompanied to her appointment by her partner, [name] "Lindsay Whitney.   Indication for genetic counseling - Carrier for spinal muscular atrophy  Prenatal history  Lindsay Whitney is a [id number], 24 y.o. female. Her current pregnancy has completed [redacted]w[redacted]d (Estimated Date of Delivery: 01/09/21). Lindsay Whitney has a 69 year old daughter and a 68 year old son from a prior relationship. She has also had one miscarriage with a different partner. The current pregnancy is the first for this couple.  [name] denied exposure to environmental toxins or chemical agents. She denied the use of alcohol, tobacco or street drugs. She denied significant viral illnesses, fevers, and bleeding during the course of her pregnancy. She reported a personal history of kidney stones. Her medical and surgical histories were otherwise noncontributory.  Family History  A three generation pedigree was drafted and reviewed. The family history is remarkable for the following:  - Lindsay Whitney has a paternal half brother whose son has autism. The rest of this child's history is noncontributory, and no one else in the family has learning disabilities or autism. We reviewed that autism can be isolated, multifactorial, or part of a genetic syndrome; however, underlying genetic conditions account for less than 10% of autism diagnoses. Recurrence risk for first-degree relatives of individuals with idiopathic autism is estimated to be between 2-8%. Given that Lindsay Whitney's nephew is a fourth-degree relative to her fetus, the risk of recurrence is likely not greatly elevated above the general population risk of ~1 in 72, or 1.5%. However, without knowing the exact cause of autism in the family, risk assessment is  limited.  - Lindsay Whitney's partner, Lindsay Whitney, has a maternal aunt and maternal grandfather who had a history of brain cancer. We discussed that most cancers are thought to be sporadic or due to environmental factors. However, some families appear to have a strong predisposition to cancers. When considering a family history of cancer, we look for common types of cancer in multiple family members occurring at younger than typical ages. Brain cancer does not typically have an inherited cause; however, we discussed the option of meeting with a cancer genetic counselor to discuss any possible screening or testing options available. If Lindsay Whitney is concerned about the family history of cancer and would like to learn more about the family's chance for an inherited cancer syndrome, he or his healthcare provider may refer her to the [facility] [id number]).   - Mr. Lindsay Whitney father has psoriasis and his paternal grandmother has what sounds like rheumatoid arthritis. We discussed that these belong to the family of conditions known as autoimmune conditions. Autoimmune conditions occur when an individual's body launches an abnormal immune response and begins to destroy its own cells. There are several different autoimmune conditions. While we do know that autoimmune conditions tend to "cluster" within a family, they do not follow a clear pattern of inheritance. When there is a person in the family with an autoimmune condition, there is an increased chance for others in the family to develop an autoimmune condition, but it may be a different condition. Specific risk factor information is not available. Genetic testing is not available at this time to predict who may develop an autoimmune condition.  The remaining family histories were reviewed and found to  be noncontributory for birth defects, intellectual disability, recurrent pregnancy loss, and known genetic conditions.    The patient's ancestry is Caucasian.  The father of the pregnancy's ancestry is Caucasian. Ashkenazi Jewish ancestry and consanguinity were denied. Pedigree will be scanned under Media.  Discussion  Carrier screening results:  Lindsay Whitney had Horizon-14 carrier screening performed through the laboratory [facility]. We reviewed that Lindsay Whitney was found to have 1 copy of the SMN1 gene on this carrier screening. 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   We discussed that SMA is characterized by progressive muscle weakness and atrophy due to degeneration and loss of anterior horn cells (lower motor neurons) in the spinal cord and brain stem. We discussed the different types of SMA (0, I, II, and III), including differences in severity and age of onset.     We reviewed that SMA is inherited in an autosomal recessive pattern. This means that both parents must be carriers for the condition to be at risk of having an affected child. Based on the carrier frequency for SMA in the Caucasian population, [name] partner currently has a 1 in 35 chance of being a carrier of SMA. If Lindsay Whitney's partner is found to have 2 or more copies of SMN1 on carrier screening, his risk of being a carrier would be reduced but not eliminated. If Lindsay Whitney's partner is also a carrier for SMA, the couple would have a 1 in 4 (25%) chance of having an affected fetus with each pregnancy.   Lindsay Whitney carrier screening was negative for the other 13 conditions screened. Thus, her risk to be a carrier for these additional conditions (listed separately in the laboratory report) has been reduced but not eliminated. This also significantly reduces her risk of having a child affected by one of these conditions. We discussed that carrier testing for SMA is recommended for Lindsay Whitney's partner. Lindsay Whitney and her partner indicated that they are interested in pursuing partner carrier screening.  We also discussed that it is recommended that Lindsay Whitney inform her brother about  her SMA carrier status, as he has a 50% chance of being a carrier for SMA himself. If Ms. Wessman's brother is also identified as a carrier, his reproductive partner should consider carrier screening for SMA either during the preconception period or during pregnancy to further refine their risk of having a child affected by SMA. If Ms. Zender inherited her SMN1 variant from her father, the 50% carrier risk would apply for her half brothers as well.   Aneuploidy screening results:  We also reviewed that Ms. Ayon had Panorama noninvasive prenatal screening (NIPS) through the laboratory [facility] that was low-risk for fetal aneuploidies. We reviewed that these results showed a less than 1 in 10,000 risk for trisomies 21, 18 and 13, and monosomy X (Turner syndrome).  In addition, the risk for triploidy and sex chromosome trisomies (47,XXX and 47,XXY) was also low. Ms. Forget elected to have cfDNA analysis for 22q11.2 deletion syndrome, which was also low risk (1 in 9000). We reviewed that while this testing identifies 94-99% of pregnancies with trisomy 40, trisomy 74, trisomy 62, sex chromosome aneuploidies, and triploidy, it is NOT diagnostic. A positive test result requires confirmation by CVS or amniocentesis, and a negative test result does not rule out a fetal chromosome abnormality. She also understands that this testing does not identify all genetic conditions.  Diagnostic testing:  Ms. Barkalow was also counseled regarding diagnostic testing via  amniocentesis. We discussed the technical aspects of the procedure and quoted up to a 1 in 500 (0.2%) risk for spontaneous pregnancy loss or other adverse pregnancy outcomes as a result of amniocentesis. Cultured cells from an amniocentesis sample allow for the visualization of a fetal karyotype, which can detect >99% of large chromosomal aberrations. Chromosomal microarray can also be performed to identify smaller deletions or duplications of fetal chromosomal material.  Amniocentesis could also be performed to assess whether the baby is affected by SMA. After careful consideration, Ms. Schonberger declined amniocentesis at this time. She understands that amniocentesis is available at any point after 16 weeks of pregnancy and that she may opt to undergo the procedure at a later date should she change her mind.  Plan:  Ms. Smits partner Lindsay Whitney had his blood drawn for SMA carrier screening today. Given that he does not currently have active health insurance coverage, he qualified for free testing through [facility]'s Patient Assistance Program. Results from carrier screening will take 2-3 weeks to be returned. I will call the couple once results become available.  I counseled Ms. Bittel regarding the above risks and available options. The approximate face-to-face time with the genetic counselor was 35 minutes.  In summary:  Discussed carrier screening results and options for follow-up testing  Carrier for spinal muscular atrophy  Sample drawn for partner carrier screening. We will follow results  Reviewed low-risk NIPS result  Reduction in risk for Down syndrome, trisomy 73, trisomy 72, triploidy, sex chromosome aneuploidies, and 22q11.2 deletion syndrome  Offered additional testing and screening  Declined amniocentesis  Reviewed family history concerns   Gershon Crane, MS, Aeronautical engineer

## 2020-09-03 ENCOUNTER — Encounter: Payer: Self-pay | Admitting: Genetic Counselor

## 2020-09-07 ENCOUNTER — Telehealth: Payer: Self-pay | Admitting: Genetic Counselor

## 2020-09-07 NOTE — Telephone Encounter (Signed)
I called Ms. Havey to discuss her partner [name] Bruce's negative carrier screening for spinal muscular atrophy (SMA). [name] was identified to have 2 copies of the SMN1 gene and was negative for the c. *3+80T>G SNP. This result reduces, but does not eliminate his risk to be a carrier for SMA. Based on the sensitivity of the screen, there is a 1 in 880 residual risk for Mr. Popper to be a carrier for SMA. Given that Ms. Freiermuth is a known carrier, the couple's chances of having a baby affected by SMA have decreased to 1 in 3520 (0.03%).   I also informed Ms. Maggart that I need a copy of her partner's 1040 tax return rather than his W2 in addition to his signature on an updated application for [facility]'s Patient Assistance Program. This will ensure that his testing is free. I sent her a copy of the new application via MyChart and she agreed to reply with his signed form and a copy of his 1040.  I asked Ms. Yarbro if she would like me to contact her partner by phone to inform him about these results. She indicated that she would inform him herself. I encouraged her to have her partner contact me should he have any further questions about his results. Ms. Frankson confirmed that she had no further questions at this time.  Gershon Crane, MS, [facility] Genetic Counselor

## 2020-09-25 ENCOUNTER — Telehealth: Payer: Medicaid Other | Admitting: Certified Nurse Midwife

## 2020-09-28 ENCOUNTER — Telehealth (INDEPENDENT_AMBULATORY_CARE_PROVIDER_SITE_OTHER): Payer: Medicaid Other | Admitting: Obstetrics

## 2020-09-28 ENCOUNTER — Encounter: Payer: Self-pay | Admitting: Obstetrics

## 2020-09-28 VITALS — BP 111/72 | HR 76

## 2020-09-28 DIAGNOSIS — N760 Acute vaginitis: Secondary | ICD-10-CM

## 2020-09-28 DIAGNOSIS — O26891 Other specified pregnancy related conditions, first trimester: Secondary | ICD-10-CM

## 2020-09-28 DIAGNOSIS — B9689 Other specified bacterial agents as the cause of diseases classified elsewhere: Secondary | ICD-10-CM

## 2020-09-28 DIAGNOSIS — Z3481 Encounter for supervision of other normal pregnancy, first trimester: Secondary | ICD-10-CM

## 2020-09-28 DIAGNOSIS — Z3A25 25 weeks gestation of pregnancy: Secondary | ICD-10-CM

## 2020-09-28 MED ORDER — METRONIDAZOLE 500 MG PO TABS: 500.0000 mg | ORAL_TABLET | Freq: Two times a day (BID) | ORAL | 2 refills | Status: DC
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
## 2020-09-28 NOTE — Progress Notes (Signed)
I connected with Lindsay Whitney on 09/28/20 at  1:15 PM EST by telephone and verified that I am speaking with the correct person using two identifiers.   Pt reports malodorous vaginal discharge with odor. Denies vaginal itching

## 2020-10-13 ENCOUNTER — Encounter (HOSPITAL_COMMUNITY): Payer: Self-pay | Admitting: Obstetrics and Gynecology

## 2020-10-13 ENCOUNTER — Telehealth: Payer: Self-pay | Admitting: *Deleted

## 2020-10-13 ENCOUNTER — Other Ambulatory Visit: Payer: Self-pay

## 2020-10-13 ENCOUNTER — Inpatient Hospital Stay (HOSPITAL_COMMUNITY)
Admission: AD | Admit: 2020-10-13 | Discharge: 2020-10-13 | Disposition: A | Discharge status: Home / Self Care | Payer: Medicaid Other | Attending: Obstetrics and Gynecology | Admitting: Obstetrics and Gynecology

## 2020-10-13 ENCOUNTER — Inpatient Hospital Stay (HOSPITAL_BASED_OUTPATIENT_CLINIC_OR_DEPARTMENT_OTHER): Payer: Medicaid Other

## 2020-10-13 DIAGNOSIS — Z6790 Unspecified blood type, Rh positive: Secondary | ICD-10-CM | POA: Diagnosis not present

## 2020-10-13 DIAGNOSIS — Z3A27 27 weeks gestation of pregnancy: Secondary | ICD-10-CM

## 2020-10-13 DIAGNOSIS — O4692 Antepartum hemorrhage, unspecified, second trimester: Secondary | ICD-10-CM

## 2020-10-13 DIAGNOSIS — Z148 Genetic carrier of other disease: Secondary | ICD-10-CM | POA: Diagnosis not present

## 2020-10-13 DIAGNOSIS — Z3689 Encounter for other specified antenatal screening: Secondary | ICD-10-CM

## 2020-10-13 LAB — WET PREP, GENITAL
Clue Cells Wet Prep HPF POC: NONE SEEN
Sperm: NONE SEEN
Trich, Wet Prep: NONE SEEN
Yeast Wet Prep HPF POC: NONE SEEN

## 2020-10-13 LAB — URINALYSIS, ROUTINE W REFLEX MICROSCOPIC
Bacteria, UA: NONE SEEN
Bilirubin Urine: NEGATIVE
Glucose, UA: 50 mg/dL — AB
Ketones, ur: NEGATIVE mg/dL
Leukocytes,Ua: NEGATIVE
Nitrite: NEGATIVE
Protein, ur: NEGATIVE mg/dL
RBC / HPF: >50 RBC/hpf — ABNORMAL HIGH (ref 0–5)
Specific Gravity, Urine: 1.025 (ref 1.005–1.030)
pH: 5 (ref 5.0–8.0)

## 2020-10-13 LAB — GC/CHLAMYDIA PROBE AMP (~~LOC~~) NOT AT ARMC
Chlamydia: NEGATIVE
Comment: NEGATIVE
Comment: NORMAL
Neisseria Gonorrhea: NEGATIVE

## 2020-10-13 MED ORDER — ACETAMINOPHEN 500 MG PO TABS
1000.0000 mg | ORAL_TABLET | Freq: Once | ORAL | Status: AC
  Administered 2020-10-13: 1000 mg via ORAL
  Filled 2020-10-13: qty 2

## 2020-10-13 NOTE — MAU Provider Note (Signed)
History     CSN: [id number]  Arrival date and time: 10/13/20 5681   First Provider Initiated Contact with Patient 10/13/20 [phone]      Chief Complaint  Patient presents with  . Vaginal Bleeding   Lindsay Whitney is a 24 y.o. [id number] at 54w3dwho receives care at [facility].  She presents today for Vaginal Bleeding.  She states she was having intercourse and noticed "a pool of blood."  Patient goes on to quantify it to the approximate size of a cantaloupe.  Patient reports this occurred around 0830 am.  Patient reports some spotting upon arrival. She reports some cramping that started about 10 minutes ago.  Patient reports it is located in the lower abdomen and is intermittent in nature.  She rates it a 7-8/10 when it occurs.  Patient states she was recently treated for "7 days for discharge."  Patient clarifies that it was BV and she was taking metronidazole twice daily.  Patient endorses fetal movement and reports "normal" leaking of fluid.   OB History    Gravida  4   Para  2   Term  2   Preterm      AB  1   Living  2     SAB  1   TAB      Ectopic      Multiple      Live Births  2           Past Medical History:  Diagnosis Date  . Kidney stones 2013  . Pyelonephritis 2013  . UTI (lower urinary tract infection)     Past Surgical History:  Procedure Laterality Date  . NO PAST SURGERIES      Family History  Problem Relation Age of Onset  . Diabetes Maternal Grandmother   . Diabetes Maternal Grandfather     Social History   Tobacco Use  . Smoking status: Never Smoker  . Smokeless tobacco: Never Used  Vaping Use  . Vaping Use: Never used  Substance Use Topics  . Alcohol use: Not Currently    Comment: stopped after confimred pregnancy  . Drug use: No    Allergies: No Known Allergies  Medications Prior to Admission  Medication Sig Dispense Refill Last Dose  . Prenatal Vit-Min-FA-Fish Oil ([facility] PRENATAL GUMMY PO) Take 2 tablets by mouth daily.    10/13/2020 at Unknown time  . Blood Pressure KIT 1 kit by Does not apply route once a week. 1 kit 0   . metroNIDAZOLE (FLAGYL) 500 MG tablet Take 1 tablet (500 mg total) by mouth 2 (two) times daily. 14 tablet 2   . simethicone (GAS-X) 80 MG chewable tablet Chew 1 tablet (80 mg total) by mouth every 6 (six) hours as needed for flatulence. (Patient not taking: Reported on 09/28/2020) 30 tablet 0     Review of Systems  Constitutional: Negative for chills and fever.  Respiratory: Negative for cough and shortness of breath.   Gastrointestinal: Positive for abdominal pain (Cramping). Negative for constipation, diarrhea, nausea and vomiting.  Genitourinary: Positive for vaginal bleeding and vaginal discharge. Negative for difficulty urinating, dysuria and pelvic pain.  Neurological: Negative for dizziness, light-headedness and headaches.   Physical Exam   Blood pressure 120/71, pulse 89, temperature 98.2 F (36.8 C), resp. rate 17, weight 69.5 kg, last menstrual period 04/04/2020, unknown if currently breastfeeding.  Physical Exam Constitutional:      General: She is not in acute distress.    Appearance: Normal appearance.  HENT:     Head: Normocephalic and atraumatic.  Eyes:     Conjunctiva/sclera: Conjunctivae normal.  Cardiovascular:     Rate and Rhythm: Regular rhythm.  Pulmonary:     Effort: Pulmonary effort is normal. No respiratory distress.     Breath sounds: Normal breath sounds.  Abdominal:     General: Bowel sounds are normal.     Comments: Gravid  Genitourinary:    Comments: Speculum Exam: -Normal External Genitalia: Non tender, Blood noted at introitus.  -Vaginal Vault: Pink mucosa with good rugae. Small amt bright red blood in vault. Removed with faux swab x 2 -wet prep collected -Cervix:Pink, no lesions, cysts, or polyps.  Appears closed. No active bleeding from os, but blood and questionable tissue noted-GC/CT collected -Bimanual Exam:  Deferred  Musculoskeletal:         General: Normal range of motion.     Cervical back: Normal range of motion.  Skin:    General: Skin is warm and dry.  Neurological:     Mental Status: She is alert and oriented to person, place, and time.  Psychiatric:        Mood and Affect: Mood normal.        Behavior: Behavior normal.        Thought Content: Thought content normal.     Fetal Assessment 150 bpm, Mod Var, -Decels, +Accels Toco: Occasional Ctx  MAU Course   Results for orders placed or performed during the hospital encounter of 10/13/20 (from the past 24 hour(s))  Urinalysis, Routine w reflex microscopic Urine, Clean Catch     Status: Abnormal   Collection Time: 10/13/20  9:46 AM  Result Value Ref Range   Color, Urine YELLOW YELLOW   APPearance CLEAR CLEAR   Specific Gravity, Urine 1.025 1.005 - 1.030   pH 5.0 5.0 - 8.0   Glucose, UA 50 (A) NEGATIVE mg/dL   Hgb urine dipstick MODERATE (A) NEGATIVE   Bilirubin Urine NEGATIVE NEGATIVE   Ketones, ur NEGATIVE NEGATIVE mg/dL   Protein, ur NEGATIVE NEGATIVE mg/dL   Nitrite NEGATIVE NEGATIVE   Leukocytes,Ua NEGATIVE NEGATIVE   RBC / HPF >50 (H) 0 - 5 RBC/hpf   WBC, UA 0-5 0 - 5 WBC/hpf   Bacteria, UA NONE SEEN NONE SEEN   Squamous Epithelial / LPF 0-5 0 - 5   Mucus PRESENT   Wet prep, genital     Status: Abnormal   Collection Time: 10/13/20 10:07 AM  Result Value Ref Range   Yeast Wet Prep HPF POC NONE SEEN NONE SEEN   Trich, Wet Prep NONE SEEN NONE SEEN   Clue Cells Wet Prep HPF POC NONE SEEN NONE SEEN   WBC, Wet Prep HPF POC FEW (A) NONE SEEN   Sperm NONE SEEN      MDM PE Labs: UA, Wet Prep, GC/CT EFM Ultrasound Assessment and Plan  24 year old [id number]  SIUP at 27.3weeks Cat I FT Vaginal Bleeding Rh Positive  -POC Reviewed. -Exam performed and findings discussed. -Cultures collected and pending.  -Reassured that no active bleeding, but blood noted. -Will send for [place] to assess placenta. -Patient offered and accepts pain medication.   Given tylenol. -NST Reactive.  [place], CNM 10/13/2020, 9:55 AM   Reassessment (11:30 AM)  -[place] returns as above. -Patient informed of findings and results.  -Encouraged pelvic rest x 72 upon cessation of bleeding. -Bleeding precautions given. -Encouraged to call or return to MAU if symptoms worsen or with the  onset of new symptoms. -Discharged to home in stable condition.  Maryann Conners MSN, CNM Advanced Practice Provider, [facility]

## 2020-10-13 NOTE — Telephone Encounter (Signed)
Patient called stating she was having bleeding after sex. The bleeding was bright red and a puddle in the bed. Advised patient that she should be seen at MAU for further evaluation. Patient is currently [redacted]w[redacted]d gestation.  Clovis Pu, RN

## 2020-10-13 NOTE — MAU Note (Incomplete)
Patient reports having vaginal bleeding that started w/ intercourse this morning.  Some lower abdominal cramping that comes and goes since then.  Denies LOF.  Unsure whether she's feeling FM or not.

## 2020-10-13 NOTE — Discharge Instructions (Signed)
Vaginal Bleeding During Pregnancy, Third Trimester  A small amount of bleeding from the vagina (spotting) is relatively common during pregnancy. Various things can cause bleeding or spotting during pregnancy. Sometimes bleeding is normal and is not a problem. However, bleeding during the third trimester can also be a sign of something serious for the mother and the baby. Be sure to tell your health care provider about any vaginal bleeding right away. Some possible causes of vaginal bleeding during the third trimester include:  Infection or growths (polyps) on the cervix.  A condition in which the placenta partially or completely covers the opening of the cervix inside the uterus (placenta previa).  The placenta separating from the uterus (placenta abruption).  The start of labor (discharging of the mucus plug).  A condition in which the placenta grows into the muscle layer of the uterus (placenta accreta). Follow these instructions at home: Activity  Follow instructions from your health care provider about limiting your activity. If your health care provider recommends activity restriction, you may need to stay in bed and only get up to use the bathroom. In some cases, your health care provider may allow you to continue light activity.  If needed, make plans for someone to help with your regular activities.  Ask your health care provider if it is safe for you to drive.  Do not lift anything that is heavier than 10 lb (4.5 kg), or the limit that your health care provider tells you, until he or she says that it is safe.  Do not have sex or orgasms until your health care provider says that this is safe. Medicines  Take over-the-counter and prescription medicines only as told by your health care provider.  Do not take aspirin because it can cause bleeding. General instructions  Pay attention to any changes in your symptoms.  Write down how many pads you use each day, how often you  change pads, and how soaked (saturated) they are.  Do not use tampons or douche.  If you pass any tissue from your vagina, save the tissue so you can show it to your health care provider.  Keep all follow-up visits as told by your health care provider. This is important. Contact a health care provider if:  You have vaginal bleeding during any part of your pregnancy.  You have cramps or labor pains.  You have a fever. Get help right away if:  You have severe cramps or pain in your back or abdomen.  You have a gush of fluid from the vagina.  You pass large clots or a large amount of tissue from your vagina.  Your bleeding increases.  You feel light-headed or weak.  You faint.  You feel that your baby is moving less than usual, or not moving at all. Summary  Various things can cause bleeding or spotting in pregnancy.  Bleeding during the third trimester can be a sign of a serious problem for the mother and the baby.  Be sure to tell your health care provider about any vaginal bleeding right away. This information is not intended to replace advice given to you by your health care provider. Make sure you discuss any questions you have with your health care provider. Document Revised: 02/12/2019 Document Reviewed: 01/26/2017 Elsevier Patient Education  2020 Elsevier Inc.  

## 2020-10-19 ENCOUNTER — Encounter: Payer: Self-pay | Admitting: Advanced Practice Midwife

## 2020-10-19 ENCOUNTER — Other Ambulatory Visit: Payer: Self-pay

## 2020-10-19 ENCOUNTER — Other Ambulatory Visit: Payer: Medicaid Other

## 2020-10-19 ENCOUNTER — Ambulatory Visit (INDEPENDENT_AMBULATORY_CARE_PROVIDER_SITE_OTHER): Payer: Medicaid Other | Admitting: Advanced Practice Midwife

## 2020-10-19 VITALS — BP 108/71 | HR 88 | Wt 155.0 lb

## 2020-10-19 DIAGNOSIS — Z3A28 28 weeks gestation of pregnancy: Secondary | ICD-10-CM

## 2020-10-19 DIAGNOSIS — R109 Unspecified abdominal pain: Secondary | ICD-10-CM

## 2020-10-19 DIAGNOSIS — Z3481 Encounter for supervision of other normal pregnancy, first trimester: Secondary | ICD-10-CM

## 2020-10-19 DIAGNOSIS — N930 Postcoital and contact bleeding: Secondary | ICD-10-CM

## 2020-10-19 DIAGNOSIS — Z148 Genetic carrier of other disease: Secondary | ICD-10-CM

## 2020-10-19 DIAGNOSIS — O26893 Other specified pregnancy related conditions, third trimester: Secondary | ICD-10-CM

## 2020-10-19 MED ORDER — COMFORT FIT MATERNITY SUPP MED MISC: 1.0000 | Freq: Every day | 0 refills | Status: DC

## 2020-10-19 NOTE — Patient Instructions (Signed)
Third Trimester of Pregnancy The third trimester is from week 28 through week 40 (months 7 through 9). The third trimester is a time when the unborn baby (fetus) is growing rapidly. At the end of the ninth month, the fetus is about 20 inches in length and weighs 6-10 pounds. Body changes during your third trimester Your body will continue to go through many changes during pregnancy. The changes vary from woman to woman. During the third trimester:  Your weight will continue to increase. You can expect to gain 25-35 pounds (11-16 kg) by the end of the pregnancy.  You may begin to get stretch marks on your hips, abdomen, and breasts.  You may urinate more often because the fetus is moving lower into your pelvis and pressing on your bladder.  You may develop or continue to have heartburn. This is caused by increased hormones that slow down muscles in the digestive tract.  You may develop or continue to have constipation because increased hormones slow digestion and cause the muscles that push waste through your intestines to relax.  You may develop hemorrhoids. These are swollen veins (varicose veins) in the rectum that can itch or be painful.  You may develop swollen, bulging veins (varicose veins) in your legs.  You may have increased body aches in the pelvis, back, or thighs. This is due to weight gain and increased hormones that are relaxing your joints.  You may have changes in your hair. These can include thickening of your hair, rapid growth, and changes in texture. Some women also have hair loss during or after pregnancy, or hair that feels dry or thin. Your hair will most likely return to normal after your baby is born.  Your breasts will continue to grow and they will continue to become tender. A yellow fluid (colostrum) may leak from your breasts. This is the first milk you are producing for your baby.  Your belly button may stick out.  You may notice more swelling in your hands,  face, or ankles.  You may have increased tingling or numbness in your hands, arms, and legs. The skin on your belly may also feel numb.  You may feel short of breath because of your expanding uterus.  You may have more problems sleeping. This can be caused by the size of your belly, increased need to urinate, and an increase in your body's metabolism.  You may notice the fetus "dropping," or moving lower in your abdomen (lightening).  You may have increased vaginal discharge.  You may notice your joints feel loose and you may have pain around your pelvic bone. What to expect at prenatal visits You will have prenatal exams every 2 weeks until week 36. Then you will have weekly prenatal exams. During a routine prenatal visit:  You will be weighed to make sure you and the baby are growing normally.  Your blood pressure will be taken.  Your abdomen will be measured to track your baby's growth.  The fetal heartbeat will be listened to.  Any test results from the previous visit will be discussed.  You may have a cervical check near your due date to see if your cervix has softened or thinned (effaced).  You will be tested for Group B streptococcus. This happens between 35 and 37 weeks. Your health care provider may ask you:  What your birth plan is.  How you are feeling.  If you are feeling the baby move.  If you have had any abnormal   symptoms, such as leaking fluid, bleeding, severe headaches, or abdominal cramping.  If you are using any tobacco products, including cigarettes, chewing tobacco, and electronic cigarettes.  If you have any questions. Other tests or screenings that may be performed during your third trimester include:  Blood tests that check for low iron levels (anemia).  Fetal testing to check the health, activity level, and growth of the fetus. Testing is done if you have certain medical conditions or if there are problems during the pregnancy.  Nonstress test  (NST). This test checks the health of your baby to make sure there are no signs of problems, such as the baby not getting enough oxygen. During this test, a belt is placed around your belly. The baby is made to move, and its heart rate is monitored during movement. What is false labor? False labor is a condition in which you feel small, irregular tightenings of the muscles in the womb (contractions) that usually go away with rest, changing position, or drinking water. These are called Braxton Hicks contractions. Contractions may last for hours, days, or even weeks before true labor sets in. If contractions come at regular intervals, become more frequent, increase in intensity, or become painful, you should see your health care provider. What are the signs of labor?  Abdominal cramps.  Regular contractions that start at 10 minutes apart and become stronger and more frequent with time.  Contractions that start on the top of the uterus and spread down to the lower abdomen and back.  Increased pelvic pressure and dull back pain.  A watery or bloody mucus discharge that comes from the vagina.  Leaking of amniotic fluid. This is also known as your "water breaking." It could be a slow trickle or a gush. Let your health care provider know if it has a color or strange odor. If you have any of these signs, call your health care provider right away, even if it is before your due date. Follow these instructions at home: Medicines  Follow your health care provider's instructions regarding medicine use. Specific medicines may be either safe or unsafe to take during pregnancy.  Take a prenatal vitamin that contains at least 600 micrograms (mcg) of folic acid.  If you develop constipation, try taking a stool softener if your health care provider approves. Eating and drinking   Eat a balanced diet that includes fresh fruits and vegetables, whole grains, good sources of protein such as meat, eggs, or tofu,  and low-fat dairy. Your health care provider will help you determine the amount of weight gain that is right for you.  Avoid raw meat and uncooked cheese. These carry germs that can cause birth defects in the baby.  If you have low calcium intake from food, talk to your health care provider about whether you should take a daily calcium supplement.  Eat four or five small meals rather than three large meals a day.  Limit foods that are high in fat and processed sugars, such as fried and sweet foods.  To prevent constipation: ? Drink enough fluid to keep your urine clear or pale yellow. ? Eat foods that are high in fiber, such as fresh fruits and vegetables, whole grains, and beans. Activity  Exercise only as directed by your health care provider. Most women can continue their usual exercise routine during pregnancy. Try to exercise for 30 minutes at least 5 days a week. Stop exercising if you experience uterine contractions.  Avoid heavy lifting.  Do   not exercise in extreme heat or humidity, or at high altitudes.  Wear low-heel, comfortable shoes.  Practice good posture.  You may continue to have sex unless your health care provider tells you otherwise. Relieving pain and discomfort  Take frequent breaks and rest with your legs elevated if you have leg cramps or low back pain.  Take warm sitz baths to soothe any pain or discomfort caused by hemorrhoids. Use hemorrhoid cream if your health care provider approves.  Wear a good support bra to prevent discomfort from breast tenderness.  If you develop varicose veins: ? Wear support pantyhose or compression stockings as told by your healthcare provider. ? Elevate your feet for 15 minutes, 3-4 times a day. Prenatal care  Write down your questions. Take them to your prenatal visits.  Keep all your prenatal visits as told by your health care provider. This is important. Safety  Wear your seat belt at all times when driving.  Make  a list of emergency phone numbers, including numbers for family, friends, the hospital, and police and fire departments. General instructions  Avoid cat litter boxes and soil used by cats. These carry germs that can cause birth defects in the baby. If you have a cat, ask someone to clean the litter box for you.  Do not travel far distances unless it is absolutely necessary and only with the approval of your health care provider.  Do not use hot tubs, steam rooms, or saunas.  Do not drink alcohol.  Do not use any products that contain nicotine or tobacco, such as cigarettes and e-cigarettes. If you need help quitting, ask your health care provider.  Do not use any medicinal herbs or unprescribed drugs. These chemicals affect the formation and growth of the baby.  Do not douche or use tampons or scented sanitary pads.  Do not cross your legs for long periods of time.  To prepare for the arrival of your baby: ? Take prenatal classes to understand, practice, and ask questions about labor and delivery. ? Make a trial run to the hospital. ? Visit the hospital and tour the maternity area. ? Arrange for maternity or paternity leave through employers. ? Arrange for family and friends to take care of pets while you are in the hospital. ? Purchase a rear-facing car seat and make sure you know how to install it in your car. ? Pack your hospital bag. ? Prepare the baby's nursery. Make sure to remove all pillows and stuffed animals from the baby's crib to prevent suffocation.  Visit your dentist if you have not gone during your pregnancy. Use a soft toothbrush to brush your teeth and be gentle when you floss. Contact a health care provider if:  You are unsure if you are in labor or if your water has broken.  You become dizzy.  You have mild pelvic cramps, pelvic pressure, or nagging pain in your abdominal area.  You have lower back pain.  You have persistent nausea, vomiting, or  diarrhea.  You have an unusual or bad smelling vaginal discharge.  You have pain when you urinate. Get help right away if:  Your water breaks before 37 weeks.  You have regular contractions less than 5 minutes apart before 37 weeks.  You have a fever.  You are leaking fluid from your vagina.  You have spotting or bleeding from your vagina.  You have severe abdominal pain or cramping.  You have rapid weight loss or weight gain.  You have   shortness of breath with chest pain.  You notice sudden or extreme swelling of your face, hands, ankles, feet, or legs.  Your baby makes fewer than 10 movements in 2 hours.  You have severe headaches that do not go away when you take medicine.  You have vision changes. Summary  The third trimester is from week 28 through week 40, months 7 through 9. The third trimester is a time when the unborn baby (fetus) is growing rapidly.  During the third trimester, your discomfort may increase as you and your baby continue to gain weight. You may have abdominal, leg, and back pain, sleeping problems, and an increased need to urinate.  During the third trimester your breasts will keep growing and they will continue to become tender. A yellow fluid (colostrum) may leak from your breasts. This is the first milk you are producing for your baby.  False labor is a condition in which you feel small, irregular tightenings of the muscles in the womb (contractions) that eventually go away. These are called Braxton Hicks contractions. Contractions may last for hours, days, or even weeks before true labor sets in.  Signs of labor can include: abdominal cramps; regular contractions that start at 10 minutes apart and become stronger and more frequent with time; watery or bloody mucus discharge that comes from the vagina; increased pelvic pressure and dull back pain; and leaking of amniotic fluid. This information is not intended to replace advice given to you by your  health care provider. Make sure you discuss any questions you have with your health care provider. Document Revised: 02/14/2019 Document Reviewed: 11/29/2016 Elsevier Patient Education  2020 [employer] Inc.  
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
## 2020-10-19 NOTE — Progress Notes (Signed)
ROB GTT Today. Recent MAU visit for bleeding.  T-Dap: Declined  CC: None

## 2020-10-20 LAB — CBC
Hematocrit: 30.6 % — ABNORMAL LOW (ref 34.0–46.6)
Hemoglobin: 10.8 g/dL — ABNORMAL LOW (ref 11.1–15.9)
MCH: 30.9 pg (ref 26.6–33.0)
MCHC: 35.3 g/dL (ref 31.5–35.7)
MCV: 87 fL (ref 79–97)
Platelets: 120 10*3/uL — ABNORMAL LOW (ref 150–450)
RBC: 3.5 x10E6/uL — ABNORMAL LOW (ref 3.77–5.28)
RDW: 12 % (ref 11.7–15.4)
WBC: 6.5 10*3/uL (ref 3.4–10.8)

## 2020-10-20 LAB — RPR: RPR Ser Ql: NONREACTIVE

## 2020-10-20 LAB — GLUCOSE TOLERANCE, 2 HOURS W/ 1HR
Glucose, 1 hour: 140 mg/dL (ref 65–179)
Glucose, 2 hour: 136 mg/dL (ref 65–152)
Glucose, Fasting: 78 mg/dL (ref 65–91)

## 2020-10-20 LAB — HIV ANTIBODY (ROUTINE TESTING W REFLEX): HIV Screen 4th Generation wRfx: NONREACTIVE

## 2020-10-21 LAB — URINE CULTURE, OB REFLEX

## 2020-10-21 LAB — CULTURE, OB URINE

## 2020-11-03 ENCOUNTER — Ambulatory Visit (INDEPENDENT_AMBULATORY_CARE_PROVIDER_SITE_OTHER): Payer: Medicaid Other | Admitting: Advanced Practice Midwife

## 2020-11-03 ENCOUNTER — Other Ambulatory Visit: Payer: Self-pay

## 2020-11-03 VITALS — BP 111/74 | HR 110 | Wt 157.0 lb

## 2020-11-03 DIAGNOSIS — F5105 Insomnia due to other mental disorder: Secondary | ICD-10-CM

## 2020-11-03 DIAGNOSIS — O99113 Other diseases of the blood and blood-forming organs and certain disorders involving the immune mechanism complicating pregnancy, third trimester: Secondary | ICD-10-CM

## 2020-11-03 DIAGNOSIS — D696 Thrombocytopenia, unspecified: Secondary | ICD-10-CM

## 2020-11-03 DIAGNOSIS — Z3481 Encounter for supervision of other normal pregnancy, first trimester: Secondary | ICD-10-CM

## 2020-11-03 DIAGNOSIS — Z3A30 30 weeks gestation of pregnancy: Secondary | ICD-10-CM
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
## 2020-11-03 NOTE — Progress Notes (Signed)
ROB, reports no problems today. 

## 2020-11-03 NOTE — Patient Instructions (Signed)
Third Trimester of Pregnancy The third trimester is from week 28 through week 40 (months 7 through 9). The third trimester is a time when the unborn baby (fetus) is growing rapidly. At the end of the ninth month, the fetus is about 20 inches in length and weighs 6-10 pounds. Body changes during your third trimester Your body will continue to go through many changes during pregnancy. The changes vary from woman to woman. During the third trimester:  Your weight will continue to increase. You can expect to gain 25-35 pounds (11-16 kg) by the end of the pregnancy.  You may begin to get stretch marks on your hips, abdomen, and breasts.  You may urinate more often because the fetus is moving lower into your pelvis and pressing on your bladder.  You may develop or continue to have heartburn. This is caused by increased hormones that slow down muscles in the digestive tract.  You may develop or continue to have constipation because increased hormones slow digestion and cause the muscles that push waste through your intestines to relax.  You may develop hemorrhoids. These are swollen veins (varicose veins) in the rectum that can itch or be painful.  You may develop swollen, bulging veins (varicose veins) in your legs.  You may have increased body aches in the pelvis, back, or thighs. This is due to weight gain and increased hormones that are relaxing your joints.  You may have changes in your hair. These can include thickening of your hair, rapid growth, and changes in texture. Some women also have hair loss during or after pregnancy, or hair that feels dry or thin. Your hair will most likely return to normal after your baby is born.  Your breasts will continue to grow and they will continue to become tender. A yellow fluid (colostrum) may leak from your breasts. This is the first milk you are producing for your baby.  Your belly button may stick out.  You may notice more swelling in your hands,  face, or ankles.  You may have increased tingling or numbness in your hands, arms, and legs. The skin on your belly may also feel numb.  You may feel short of breath because of your expanding uterus.  You may have more problems sleeping. This can be caused by the size of your belly, increased need to urinate, and an increase in your body's metabolism.  You may notice the fetus "dropping," or moving lower in your abdomen (lightening).  You may have increased vaginal discharge.  You may notice your joints feel loose and you may have pain around your pelvic bone. What to expect at prenatal visits You will have prenatal exams every 2 weeks until week 36. Then you will have weekly prenatal exams. During a routine prenatal visit:  You will be weighed to make sure you and the baby are growing normally.  Your blood pressure will be taken.  Your abdomen will be measured to track your baby's growth.  The fetal heartbeat will be listened to.  Any test results from the previous visit will be discussed.  You may have a cervical check near your due date to see if your cervix has softened or thinned (effaced).  You will be tested for Group B streptococcus. This happens between 35 and 37 weeks. Your health care provider may ask you:  What your birth plan is.  How you are feeling.  If you are feeling the baby move.  If you have had any abnormal   symptoms, such as leaking fluid, bleeding, severe headaches, or abdominal cramping.  If you are using any tobacco products, including cigarettes, chewing tobacco, and electronic cigarettes.  If you have any questions. Other tests or screenings that may be performed during your third trimester include:  Blood tests that check for low iron levels (anemia).  Fetal testing to check the health, activity level, and growth of the fetus. Testing is done if you have certain medical conditions or if there are problems during the pregnancy.  Nonstress test  (NST). This test checks the health of your baby to make sure there are no signs of problems, such as the baby not getting enough oxygen. During this test, a belt is placed around your belly. The baby is made to move, and its heart rate is monitored during movement. What is false labor? False labor is a condition in which you feel small, irregular tightenings of the muscles in the womb (contractions) that usually go away with rest, changing position, or drinking water. These are called Braxton Hicks contractions. Contractions may last for hours, days, or even weeks before true labor sets in. If contractions come at regular intervals, become more frequent, increase in intensity, or become painful, you should see your health care provider. What are the signs of labor?  Abdominal cramps.  Regular contractions that start at 10 minutes apart and become stronger and more frequent with time.  Contractions that start on the top of the uterus and spread down to the lower abdomen and back.  Increased pelvic pressure and dull back pain.  A watery or bloody mucus discharge that comes from the vagina.  Leaking of amniotic fluid. This is also known as your "water breaking." It could be a slow trickle or a gush. Let your health care provider know if it has a color or strange odor. If you have any of these signs, call your health care provider right away, even if it is before your due date. Follow these instructions at home: Medicines  Follow your health care provider's instructions regarding medicine use. Specific medicines may be either safe or unsafe to take during pregnancy.  Take a prenatal vitamin that contains at least 600 micrograms (mcg) of folic acid.  If you develop constipation, try taking a stool softener if your health care provider approves. Eating and drinking   Eat a balanced diet that includes fresh fruits and vegetables, whole grains, good sources of protein such as meat, eggs, or tofu,  and low-fat dairy. Your health care provider will help you determine the amount of weight gain that is right for you.  Avoid raw meat and uncooked cheese. These carry germs that can cause birth defects in the baby.  If you have low calcium intake from food, talk to your health care provider about whether you should take a daily calcium supplement.  Eat four or five small meals rather than three large meals a day.  Limit foods that are high in fat and processed sugars, such as fried and sweet foods.  To prevent constipation: ? Drink enough fluid to keep your urine clear or pale yellow. ? Eat foods that are high in fiber, such as fresh fruits and vegetables, whole grains, and beans. Activity  Exercise only as directed by your health care provider. Most women can continue their usual exercise routine during pregnancy. Try to exercise for 30 minutes at least 5 days a week. Stop exercising if you experience uterine contractions.  Avoid heavy lifting.  Do   not exercise in extreme heat or humidity, or at high altitudes.  Wear low-heel, comfortable shoes.  Practice good posture.  You may continue to have sex unless your health care provider tells you otherwise. Relieving pain and discomfort  Take frequent breaks and rest with your legs elevated if you have leg cramps or low back pain.  Take warm sitz baths to soothe any pain or discomfort caused by hemorrhoids. Use hemorrhoid cream if your health care provider approves.  Wear a good support bra to prevent discomfort from breast tenderness.  If you develop varicose veins: ? Wear support pantyhose or compression stockings as told by your healthcare provider. ? Elevate your feet for 15 minutes, 3-4 times a day. Prenatal care  Write down your questions. Take them to your prenatal visits.  Keep all your prenatal visits as told by your health care provider. This is important. Safety  Wear your seat belt at all times when driving.  Make  a list of emergency phone numbers, including numbers for family, friends, the hospital, and police and fire departments. General instructions  Avoid cat litter boxes and soil used by cats. These carry germs that can cause birth defects in the baby. If you have a cat, ask someone to clean the litter box for you.  Do not travel far distances unless it is absolutely necessary and only with the approval of your health care provider.  Do not use hot tubs, steam rooms, or saunas.  Do not drink alcohol.  Do not use any products that contain nicotine or tobacco, such as cigarettes and e-cigarettes. If you need help quitting, ask your health care provider.  Do not use any medicinal herbs or unprescribed drugs. These chemicals affect the formation and growth of the baby.  Do not douche or use tampons or scented sanitary pads.  Do not cross your legs for long periods of time.  To prepare for the arrival of your baby: ? Take prenatal classes to understand, practice, and ask questions about labor and delivery. ? Make a trial run to the hospital. ? Visit the hospital and tour the maternity area. ? Arrange for maternity or paternity leave through employers. ? Arrange for family and friends to take care of pets while you are in the hospital. ? Purchase a rear-facing car seat and make sure you know how to install it in your car. ? Pack your hospital bag. ? Prepare the baby's nursery. Make sure to remove all pillows and stuffed animals from the baby's crib to prevent suffocation.  Visit your dentist if you have not gone during your pregnancy. Use a soft toothbrush to brush your teeth and be gentle when you floss. Contact a health care provider if:  You are unsure if you are in labor or if your water has broken.  You become dizzy.  You have mild pelvic cramps, pelvic pressure, or nagging pain in your abdominal area.  You have lower back pain.  You have persistent nausea, vomiting, or  diarrhea.  You have an unusual or bad smelling vaginal discharge.  You have pain when you urinate. Get help right away if:  Your water breaks before 37 weeks.  You have regular contractions less than 5 minutes apart before 37 weeks.  You have a fever.  You are leaking fluid from your vagina.  You have spotting or bleeding from your vagina.  You have severe abdominal pain or cramping.  You have rapid weight loss or weight gain.  You have   shortness of breath with chest pain.  You notice sudden or extreme swelling of your face, hands, ankles, feet, or legs.  Your baby makes fewer than 10 movements in 2 hours.  You have severe headaches that do not go away when you take medicine.  You have vision changes. Summary  The third trimester is from week 28 through week 40, months 7 through 9. The third trimester is a time when the unborn baby (fetus) is growing rapidly.  During the third trimester, your discomfort may increase as you and your baby continue to gain weight. You may have abdominal, leg, and back pain, sleeping problems, and an increased need to urinate.  During the third trimester your breasts will keep growing and they will continue to become tender. A yellow fluid (colostrum) may leak from your breasts. This is the first milk you are producing for your baby.  False labor is a condition in which you feel small, irregular tightenings of the muscles in the womb (contractions) that eventually go away. These are called Braxton Hicks contractions. Contractions may last for hours, days, or even weeks before true labor sets in.  Signs of labor can include: abdominal cramps; regular contractions that start at 10 minutes apart and become stronger and more frequent with time; watery or bloody mucus discharge that comes from the vagina; increased pelvic pressure and dull back pain; and leaking of amniotic fluid. This information is not intended to replace advice given to you by your  health care provider. Make sure you discuss any questions you have with your health care provider. Document Revised: 02/14/2019 Document Reviewed: 11/29/2016 Elsevier Patient Education  2020 [employer] Inc.  

## 2020-11-07 NOTE — L&D Delivery Note (Signed)
OB/GYN Faculty Practice Delivery Note  Lindsay Whitney is a 25 y.o. [id number] s/p vaginal delivery at [redacted]w[redacted]d. She was admitted for elective IOL.  ROM: 7h 10m with clear fluid GBS Status: negative Maximum Maternal Temperature: 98.75F  Labor Progress: On admission, cytotec x1 was administered and FB was placed. Pt then progressed to complete cervical dilation and had an uncomplicated delivery as noted below.  Delivery Date/Time: 01/02/21 at 2002 Delivery: Called to room and patient was complete and pushing. Head delivered vertex. No nuchal cord present vertex. Shoulder and body delivered in usual fashion. Infant with spontaneous cry, placed on mother's abdomen, dried and stimulated. Cord clamped x 2 after 1-minute delay, and cut by FOB under my direct supervision. Cord blood drawn. Placenta delivered spontaneously with gentle cord traction. Fundus firm with massage and Pitocin. Labia, perineum, vagina, and cervix were inspected, notable for first degree perineal laceration s/p repair as noted below.  Placenta: 3-vessel cord, intact, sent to L&D Complications: none Lacerations: first degree perineal laceration s/p repair in standard fashion with 3-0 vicryl EBL: 50 ml Analgesia: epidural  Infant: viable female  APGARs 8 & 9  weight per medical record  [name], MD OB/GYN Fellow, Faculty Practice

## 2020-11-17 ENCOUNTER — Other Ambulatory Visit: Payer: Self-pay

## 2020-11-17 ENCOUNTER — Ambulatory Visit (INDEPENDENT_AMBULATORY_CARE_PROVIDER_SITE_OTHER): Payer: Medicaid Other | Admitting: Advanced Practice Midwife

## 2020-11-17 VITALS — BP 113/66 | HR 96 | Wt 160.0 lb

## 2020-11-17 DIAGNOSIS — O99113 Other diseases of the blood and blood-forming organs and certain disorders involving the immune mechanism complicating pregnancy, third trimester: Secondary | ICD-10-CM

## 2020-11-17 DIAGNOSIS — D696 Thrombocytopenia, unspecified: Secondary | ICD-10-CM

## 2020-11-17 DIAGNOSIS — Z3481 Encounter for supervision of other normal pregnancy, first trimester: Secondary | ICD-10-CM

## 2020-11-17 DIAGNOSIS — Z3A32 32 weeks gestation of pregnancy: Secondary | ICD-10-CM

## 2020-11-17 NOTE — Progress Notes (Signed)
ROB, reports no problems today. 

## 2020-11-17 NOTE — Patient Instructions (Signed)
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
## 2020-12-01 ENCOUNTER — Telehealth (INDEPENDENT_AMBULATORY_CARE_PROVIDER_SITE_OTHER): Payer: Medicaid Other | Admitting: Advanced Practice Midwife

## 2020-12-01 DIAGNOSIS — D696 Thrombocytopenia, unspecified: Secondary | ICD-10-CM

## 2020-12-01 DIAGNOSIS — Z3481 Encounter for supervision of other normal pregnancy, first trimester: Secondary | ICD-10-CM

## 2020-12-01 DIAGNOSIS — Z3A34 34 weeks gestation of pregnancy: Secondary | ICD-10-CM

## 2020-12-01 DIAGNOSIS — O99113 Other diseases of the blood and blood-forming organs and certain disorders involving the immune mechanism complicating pregnancy, third trimester: Secondary | ICD-10-CM

## 2020-12-01 NOTE — Progress Notes (Signed)
ROB 34w   CC: None    
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
## 2020-12-15 ENCOUNTER — Other Ambulatory Visit: Payer: Self-pay

## 2020-12-15 ENCOUNTER — Other Ambulatory Visit (HOSPITAL_COMMUNITY)
Admission: RE | Admit: 2020-12-15 | Discharge: 2020-12-15 | Disposition: A | Discharge status: Home / Self Care | Payer: Medicaid Other | Source: Ambulatory Visit | Attending: Advanced Practice Midwife | Admitting: Advanced Practice Midwife

## 2020-12-15 ENCOUNTER — Ambulatory Visit (INDEPENDENT_AMBULATORY_CARE_PROVIDER_SITE_OTHER): Payer: Medicaid Other | Admitting: Advanced Practice Midwife

## 2020-12-15 VITALS — BP 117/66 | HR 96 | Wt 166.0 lb

## 2020-12-15 DIAGNOSIS — Z3483 Encounter for supervision of other normal pregnancy, third trimester: Secondary | ICD-10-CM | POA: Diagnosis present

## 2020-12-15 DIAGNOSIS — D696 Thrombocytopenia, unspecified: Secondary | ICD-10-CM

## 2020-12-15 DIAGNOSIS — Z3481 Encounter for supervision of other normal pregnancy, first trimester: Secondary | ICD-10-CM

## 2020-12-15 DIAGNOSIS — Z3A36 36 weeks gestation of pregnancy: Secondary | ICD-10-CM | POA: Diagnosis not present

## 2020-12-15 DIAGNOSIS — O99113 Other diseases of the blood and blood-forming organs and certain disorders involving the immune mechanism complicating pregnancy, third trimester: Secondary | ICD-10-CM

## 2020-12-15 DIAGNOSIS — O99891 Other specified diseases and conditions complicating pregnancy: Secondary | ICD-10-CM

## 2020-12-15 DIAGNOSIS — M7918 Myalgia, other site: Secondary | ICD-10-CM

## 2020-12-15 NOTE — Patient Instructions (Signed)
Things to Try After 37 weeks to Encourage Labor/Get Ready for Labor:   1.  Try the Miles Circuit at www.milescircuit.com daily to improve baby's position and encourage the onset of labor.  2. Walk a little and rest a little every day.  Change positions often.  3. Cervical Ripening: May try one or both a. Red Raspberry Leaf capsules or tea:  two 300mg or 400mg tablets with each meal, 2-3 times a day, or 1-3 cups of tea daily  Potential Side Effects Of Raspberry Leaf:  Most women do not experience any side effects from drinking raspberry leaf tea. However, nausea and loose stools are possible   b. Evening Primrose Oil capsules: take 1 capsule by mouth and place one capsule in the vagina every night.    Some of the potential side effects:  Upset stomach  Loose stools or diarrhea  Headaches  Nausea  4. Sex can also help the cervix ripen and encourage labor onset.    Labor Precautions Reasons to come to MAU at [facility]:  1.  Contractions are  5 minutes apart or less, each last 1 minute, these have been going on for 1-2 hours, and you cannot walk or talk during them 2.  You have a large gush of fluid, or a trickle of fluid that will not stop and you have to wear a pad 3.  You have bleeding that is bright red, heavier than spotting--like menstrual bleeding (spotting can be normal in early labor or after a check of your cervix) 4.  You do not feel the baby moving like he/she normally does 

## 2020-12-15 NOTE — Progress Notes (Signed)
+   Fetal movement. No complaints.  
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
## 2020-12-16 LAB — CERVICOVAGINAL ANCILLARY ONLY
Chlamydia: NEGATIVE
Comment: NEGATIVE
Comment: NEGATIVE
Comment: NORMAL
Neisseria Gonorrhea: NEGATIVE
Trichomonas: NEGATIVE

## 2020-12-16 LAB — CBC
Hematocrit: 30.3 % — ABNORMAL LOW (ref 34.0–46.6)
Hemoglobin: 9.8 g/dL — ABNORMAL LOW (ref 11.1–15.9)
MCH: 27.3 pg (ref 26.6–33.0)
MCHC: 32.3 g/dL (ref 31.5–35.7)
MCV: 84 fL (ref 79–97)
Platelets: 137 10*3/uL — ABNORMAL LOW (ref 150–450)
RBC: 3.59 x10E6/uL — ABNORMAL LOW (ref 3.77–5.28)
RDW: 12.4 % (ref 11.7–15.4)
WBC: 7.1 10*3/uL (ref 3.4–10.8)

## 2020-12-17 LAB — STREP GP B NAA: Strep Gp B NAA: NEGATIVE

## 2020-12-21 ENCOUNTER — Ambulatory Visit (INDEPENDENT_AMBULATORY_CARE_PROVIDER_SITE_OTHER): Payer: Medicaid Other | Admitting: Advanced Practice Midwife

## 2020-12-21 ENCOUNTER — Other Ambulatory Visit: Payer: Self-pay

## 2020-12-21 VITALS — BP 118/67 | HR 95 | Wt 168.0 lb

## 2020-12-21 DIAGNOSIS — O99113 Other diseases of the blood and blood-forming organs and certain disorders involving the immune mechanism complicating pregnancy, third trimester: Secondary | ICD-10-CM

## 2020-12-21 DIAGNOSIS — M7918 Myalgia, other site: Secondary | ICD-10-CM

## 2020-12-21 DIAGNOSIS — O99891 Other specified diseases and conditions complicating pregnancy: Secondary | ICD-10-CM

## 2020-12-21 DIAGNOSIS — Z3481 Encounter for supervision of other normal pregnancy, first trimester: Secondary | ICD-10-CM

## 2020-12-21 DIAGNOSIS — D696 Thrombocytopenia, unspecified: Secondary | ICD-10-CM
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
## 2020-12-23 ENCOUNTER — Other Ambulatory Visit: Payer: Self-pay | Admitting: Advanced Practice Midwife

## 2020-12-24 ENCOUNTER — Telehealth (HOSPITAL_COMMUNITY): Payer: Self-pay | Admitting: *Deleted

## 2020-12-24 ENCOUNTER — Encounter (HOSPITAL_COMMUNITY): Payer: Self-pay | Admitting: *Deleted

## 2020-12-24 NOTE — Telephone Encounter (Signed)
Preadmission screen  

## 2020-12-28 ENCOUNTER — Encounter: Payer: Self-pay | Admitting: Advanced Practice Midwife

## 2020-12-28 ENCOUNTER — Other Ambulatory Visit: Payer: Self-pay | Admitting: Advanced Practice Midwife

## 2020-12-28 DIAGNOSIS — O99013 Anemia complicating pregnancy, third trimester: Secondary | ICD-10-CM | POA: Insufficient documentation

## 2020-12-28 MED ORDER — FERROUS SULFATE 325 (65 FE) MG PO TABS: 325.0000 mg | ORAL_TABLET | ORAL | 1 refills | Status: DC

## 2020-12-29 ENCOUNTER — Other Ambulatory Visit: Payer: Self-pay

## 2020-12-29 ENCOUNTER — Ambulatory Visit (INDEPENDENT_AMBULATORY_CARE_PROVIDER_SITE_OTHER): Payer: Medicaid Other | Admitting: Advanced Practice Midwife

## 2020-12-29 VITALS — BP 112/74 | HR 97 | Wt 168.0 lb

## 2020-12-29 DIAGNOSIS — M7918 Myalgia, other site: Secondary | ICD-10-CM

## 2020-12-29 DIAGNOSIS — Z3481 Encounter for supervision of other normal pregnancy, first trimester: Secondary | ICD-10-CM

## 2020-12-29 DIAGNOSIS — O99113 Other diseases of the blood and blood-forming organs and certain disorders involving the immune mechanism complicating pregnancy, third trimester: Secondary | ICD-10-CM

## 2020-12-29 DIAGNOSIS — D696 Thrombocytopenia, unspecified: Secondary | ICD-10-CM

## 2020-12-29 DIAGNOSIS — Z3A38 38 weeks gestation of pregnancy: Secondary | ICD-10-CM

## 2020-12-29 DIAGNOSIS — O99013 Anemia complicating pregnancy, third trimester: Secondary | ICD-10-CM

## 2020-12-29 DIAGNOSIS — O99891 Other specified diseases and conditions complicating pregnancy: Secondary | ICD-10-CM
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
## 2020-12-29 NOTE — Patient Instructions (Signed)
Things to Try After 37 weeks to Encourage Labor/Get Ready for Labor:   1.  Try the Miles Circuit at www.milescircuit.com daily to improve baby's position and encourage the onset of labor.  2. Walk a little and rest a little every day.  Change positions often.  3. Cervical Ripening: May try one or both a. Red Raspberry Leaf capsules or tea:  two 300mg or 400mg tablets with each meal, 2-3 times a day, or 1-3 cups of tea daily  Potential Side Effects Of Raspberry Leaf:  Most women do not experience any side effects from drinking raspberry leaf tea. However, nausea and loose stools are possible   b. Evening Primrose Oil capsules: take 1 capsule by mouth and place one capsule in the vagina every night.    Some of the potential side effects:  Upset stomach  Loose stools or diarrhea  Headaches  Nausea  4. Sex can also help the cervix ripen and encourage labor onset.    Labor Precautions Reasons to come to [facility] at [facility]:  1.  Contractions are  5 minutes apart or less, each last 1 minute, these have been going on for 1-2 hours, and you cannot walk or talk during them 2.  You have a large gush of fluid, or a trickle of fluid that will not stop and you have to wear a pad 3.  You have bleeding that is bright red, heavier than spotting--like menstrual bleeding (spotting can be normal in early labor or after a check of your cervix) 4.  You do not feel the baby moving like he/she normally does 

## 2020-12-31 ENCOUNTER — Other Ambulatory Visit (HOSPITAL_COMMUNITY)
Admission: RE | Admit: 2020-12-31 | Discharge: 2020-12-31 | Disposition: A | Discharge status: Home / Self Care | Payer: Medicaid Other | Source: Ambulatory Visit | Attending: Family Medicine | Admitting: Family Medicine

## 2020-12-31 LAB — SARS CORONAVIRUS 2 (TAT 6-24 HRS): SARS Coronavirus 2: NEGATIVE

## 2021-01-02 ENCOUNTER — Inpatient Hospital Stay (HOSPITAL_COMMUNITY): Payer: Medicaid Other | Admitting: Anesthesiology

## 2021-01-02 ENCOUNTER — Inpatient Hospital Stay (HOSPITAL_COMMUNITY)
Admission: AD | Admit: 2021-01-02 | Discharge: 2021-01-04 | DRG: 807 | Disposition: A | Discharge status: Home / Self Care | Payer: Medicaid Other | Attending: Obstetrics and Gynecology | Admitting: Obstetrics and Gynecology

## 2021-01-02 ENCOUNTER — Other Ambulatory Visit: Payer: Self-pay

## 2021-01-02 ENCOUNTER — Encounter (HOSPITAL_COMMUNITY): Payer: Self-pay | Admitting: Family Medicine

## 2021-01-02 ENCOUNTER — Inpatient Hospital Stay (HOSPITAL_COMMUNITY): Payer: Medicaid Other

## 2021-01-02 DIAGNOSIS — E669 Obesity, unspecified: Secondary | ICD-10-CM | POA: Diagnosis present

## 2021-01-02 DIAGNOSIS — M7918 Myalgia, other site: Secondary | ICD-10-CM | POA: Diagnosis present

## 2021-01-02 DIAGNOSIS — O9912 Other diseases of the blood and blood-forming organs and certain disorders involving the immune mechanism complicating childbirth: Principal | ICD-10-CM | POA: Diagnosis present

## 2021-01-02 DIAGNOSIS — Z148 Genetic carrier of other disease: Secondary | ICD-10-CM | POA: Diagnosis not present

## 2021-01-02 DIAGNOSIS — Z20822 Contact with and (suspected) exposure to covid-19: Secondary | ICD-10-CM | POA: Diagnosis present

## 2021-01-02 DIAGNOSIS — O9902 Anemia complicating childbirth: Secondary | ICD-10-CM | POA: Diagnosis present

## 2021-01-02 DIAGNOSIS — Z3A39 39 weeks gestation of pregnancy: Secondary | ICD-10-CM | POA: Diagnosis not present

## 2021-01-02 DIAGNOSIS — Z3A30 30 weeks gestation of pregnancy: Secondary | ICD-10-CM

## 2021-01-02 DIAGNOSIS — D6959 Other secondary thrombocytopenia: Secondary | ICD-10-CM | POA: Diagnosis present

## 2021-01-02 DIAGNOSIS — D649 Anemia, unspecified: Secondary | ICD-10-CM | POA: Diagnosis present

## 2021-01-02 DIAGNOSIS — D696 Thrombocytopenia, unspecified: Secondary | ICD-10-CM | POA: Diagnosis present

## 2021-01-02 DIAGNOSIS — O99214 Obesity complicating childbirth: Secondary | ICD-10-CM | POA: Diagnosis present

## 2021-01-02 DIAGNOSIS — Z87442 Personal history of urinary calculi: Secondary | ICD-10-CM | POA: Diagnosis present

## 2021-01-02 DIAGNOSIS — O99891 Other specified diseases and conditions complicating pregnancy: Secondary | ICD-10-CM | POA: Diagnosis present

## 2021-01-02 DIAGNOSIS — O26893 Other specified pregnancy related conditions, third trimester: Secondary | ICD-10-CM | POA: Diagnosis present

## 2021-01-02 DIAGNOSIS — O99119 Other diseases of the blood and blood-forming organs and certain disorders involving the immune mechanism complicating pregnancy, unspecified trimester: Secondary | ICD-10-CM | POA: Diagnosis present

## 2021-01-02 LAB — CBC
HCT: 32.8 % — ABNORMAL LOW (ref 36.0–46.0)
HCT: 31.7 % — ABNORMAL LOW (ref 36.0–46.0)
Hemoglobin: 10.7 g/dL — ABNORMAL LOW (ref 12.0–15.0)
Hemoglobin: 10.6 g/dL — ABNORMAL LOW (ref 12.0–15.0)
MCH: 27.4 pg (ref 26.0–34.0)
MCH: 28.1 pg (ref 26.0–34.0)
MCHC: 32.6 g/dL (ref 30.0–36.0)
MCHC: 33.4 g/dL (ref 30.0–36.0)
MCV: 84.1 fL (ref 80.0–100.0)
MCV: 84.1 fL (ref 80.0–100.0)
Platelets: 130 10*3/uL — ABNORMAL LOW (ref 150–400)
Platelets: 124 10*3/uL — ABNORMAL LOW (ref 150–400)
RBC: 3.9 MIL/uL (ref 3.87–5.11)
RBC: 3.77 MIL/uL — ABNORMAL LOW (ref 3.87–5.11)
RDW: 13.3 % (ref 11.5–15.5)
RDW: 13.3 % (ref 11.5–15.5)
WBC: 9 10*3/uL (ref 4.0–10.5)
WBC: 13.3 10*3/uL — ABNORMAL HIGH (ref 4.0–10.5)
nRBC: 0 % (ref 0.0–0.2)
nRBC: 0 % (ref 0.0–0.2)

## 2021-01-02 LAB — TYPE AND SCREEN
ABO/RH(D): O POS
Antibody Screen: NEGATIVE

## 2021-01-02 LAB — RPR: RPR Ser Ql: NONREACTIVE

## 2021-01-02 MED ORDER — OXYTOCIN-SODIUM CHLORIDE 30-0.9 UT/500ML-% IV SOLN
2.5000 [IU]/h | INTRAVENOUS | Status: DC
  Administered 2021-01-02: 2.5 [IU]/h via INTRAVENOUS
  Filled 2021-01-02: qty 500

## 2021-01-02 MED ORDER — SIMETHICONE 80 MG PO CHEW: 80.0000 mg | CHEWABLE_TABLET | ORAL | Status: DC | PRN

## 2021-01-02 MED ORDER — FENTANYL-BUPIVACAINE-NACL 0.5-0.125-0.9 MG/250ML-% EP SOLN
12.0000 mL/h | EPIDURAL | Status: DC | PRN
Start: 2021-01-02 — End: 2021-01-02
  Administered 2021-01-02: 12 mL/h via EPIDURAL
  Filled 2021-01-02: qty 250

## 2021-01-02 MED ORDER — ONDANSETRON HCL 4 MG PO TABS: 4.0000 mg | ORAL_TABLET | ORAL | Status: DC | PRN

## 2021-01-02 MED ORDER — OXYCODONE-ACETAMINOPHEN 5-325 MG PO TABS: 1.0000 | ORAL_TABLET | ORAL | Status: DC | PRN

## 2021-01-02 MED ORDER — PRENATAL MULTIVITAMIN CH
1.0000 | ORAL_TABLET | Freq: Every day | ORAL | Status: DC
  Filled 2021-01-02: qty 1

## 2021-01-02 MED ORDER — TERBUTALINE SULFATE 1 MG/ML IJ SOLN: 0.2500 mg | Freq: Once | INTRAMUSCULAR | Status: DC | PRN

## 2021-01-02 MED ORDER — ACETAMINOPHEN 325 MG PO TABS
650.0000 mg | ORAL_TABLET | Freq: Four times a day (QID) | ORAL | Status: DC
  Administered 2021-01-02 – 2021-01-03 (×2): 650 mg via ORAL
  Filled 2021-01-02 (×2): qty 2

## 2021-01-02 MED ORDER — SOD CITRATE-CITRIC ACID 500-334 MG/5ML PO SOLN: 30.0000 mL | ORAL | Status: DC | PRN

## 2021-01-02 MED ORDER — OXYTOCIN BOLUS FROM INFUSION
333.0000 mL | Freq: Once | INTRAVENOUS | Status: AC
  Administered 2021-01-02: 333 mL via INTRAVENOUS

## 2021-01-02 MED ORDER — LIDOCAINE HCL (PF) 1 % IJ SOLN
INTRAMUSCULAR | Status: DC | PRN
  Administered 2021-01-02: 3 mL via EPIDURAL
  Administered 2021-01-02: 2 mL via EPIDURAL
  Administered 2021-01-02: 5 mL via EPIDURAL

## 2021-01-02 MED ORDER — LACTATED RINGERS IV SOLN: 500.0000 mL | INTRAVENOUS | Status: DC | PRN

## 2021-01-02 MED ORDER — WITCH HAZEL-GLYCERIN EX PADS: 1.0000 "application " | MEDICATED_PAD | CUTANEOUS | Status: DC | PRN

## 2021-01-02 MED ORDER — ONDANSETRON HCL 4 MG/2ML IJ SOLN: 4.0000 mg | INTRAMUSCULAR | Status: DC | PRN

## 2021-01-02 MED ORDER — ONDANSETRON HCL 4 MG/2ML IJ SOLN: 4.0000 mg | Freq: Four times a day (QID) | INTRAMUSCULAR | Status: DC | PRN

## 2021-01-02 MED ORDER — DIPHENHYDRAMINE HCL 25 MG PO CAPS: 25.0000 mg | ORAL_CAPSULE | Freq: Four times a day (QID) | ORAL | Status: DC | PRN

## 2021-01-02 MED ORDER — LACTATED RINGERS IV SOLN
500.0000 mL | Freq: Once | INTRAVENOUS | Status: AC
  Administered 2021-01-02: 500 mL via INTRAVENOUS

## 2021-01-02 MED ORDER — ACETAMINOPHEN 325 MG PO TABS: 650.0000 mg | ORAL_TABLET | ORAL | Status: DC | PRN

## 2021-01-02 MED ORDER — LIDOCAINE HCL (PF) 1 % IJ SOLN: 30.0000 mL | INTRAMUSCULAR | Status: DC | PRN

## 2021-01-02 MED ORDER — COCONUT OIL OIL: 1.0000 "application " | TOPICAL_OIL | Status: DC | PRN

## 2021-01-02 MED ORDER — TETANUS-DIPHTH-ACELL PERTUSSIS 5-2.5-18.5 LF-MCG/0.5 IM SUSY: 0.5000 mL | PREFILLED_SYRINGE | Freq: Once | INTRAMUSCULAR | Status: DC

## 2021-01-02 MED ORDER — DIBUCAINE (PERIANAL) 1 % EX OINT: 1.0000 "application " | TOPICAL_OINTMENT | CUTANEOUS | Status: DC | PRN

## 2021-01-02 MED ORDER — OXYTOCIN-SODIUM CHLORIDE 30-0.9 UT/500ML-% IV SOLN: 1.0000 m[IU]/min | INTRAVENOUS | Status: DC

## 2021-01-02 MED ORDER — MISOPROSTOL 50MCG HALF TABLET
50.0000 ug | ORAL_TABLET | ORAL | Status: DC | PRN
  Administered 2021-01-02: 50 ug via ORAL
  Filled 2021-01-02 (×2): qty 1

## 2021-01-02 MED ORDER — OXYCODONE-ACETAMINOPHEN 5-325 MG PO TABS
2.0000 | ORAL_TABLET | ORAL | Status: DC | PRN
  Administered 2021-01-02: 2 via ORAL
  Filled 2021-01-02: qty 2

## 2021-01-02 MED ORDER — IBUPROFEN 600 MG PO TABS
600.0000 mg | ORAL_TABLET | Freq: Four times a day (QID) | ORAL | Status: DC
  Administered 2021-01-02 – 2021-01-04 (×7): 600 mg via ORAL
  Filled 2021-01-02 (×7): qty 1

## 2021-01-02 MED ORDER — BENZOCAINE-MENTHOL 20-0.5 % EX AERO
1.0000 "application " | INHALATION_SPRAY | CUTANEOUS | Status: DC | PRN
  Administered 2021-01-03: 1 via TOPICAL
  Filled 2021-01-02: qty 56

## 2021-01-02 MED ORDER — SENNOSIDES-DOCUSATE SODIUM 8.6-50 MG PO TABS
2.0000 | ORAL_TABLET | Freq: Every day | ORAL | Status: DC
  Filled 2021-01-02 (×2): qty 2

## 2021-01-02 MED ORDER — LACTATED RINGERS IV SOLN: INTRAVENOUS | Status: DC

## 2021-01-02 MED ORDER — EPHEDRINE 5 MG/ML INJ: 10.0000 mg | INTRAVENOUS | Status: DC | PRN

## 2021-01-02 MED ORDER — LACTATED RINGERS IV SOLN: 500.0000 mL | Freq: Once | INTRAVENOUS | Status: DC

## 2021-01-02 MED ORDER — PHENYLEPHRINE 40 MCG/ML (10ML) SYRINGE FOR IV PUSH (FOR BLOOD PRESSURE SUPPORT): 80.0000 ug | PREFILLED_SYRINGE | INTRAVENOUS | Status: DC | PRN

## 2021-01-02 MED ORDER — DIPHENHYDRAMINE HCL 50 MG/ML IJ SOLN: 12.5000 mg | INTRAMUSCULAR | Status: DC | PRN

## 2021-01-02 NOTE — Discharge Summary (Signed)
Postpartum Discharge Summary    Patient Name: Lindsay Whitney DOB: [birth year] MRN: [id number]  Date of admission: 01/02/2021 Delivery date:01/02/2021  Delivering provider: Randa Ngo  Date of discharge: 01/04/2021  Admitting diagnosis: Pain in symphysis pubis during pregnancy [O99.891, M79.18] Intrauterine pregnancy: [redacted]w[redacted]d    Secondary diagnosis:  Principal Problem:   Vaginal delivery Active Problems:   Anemia   Personal history of kidney stones   [facility]   Carrier of muscular dystrophy   Pain in symphysis pubis during pregnancy  Additional problems: as noted above  Discharge diagnosis:  Vaginal Delivery                                          Post partum procedures:none Augmentation: AROM and Cytotec Complications: None  Hospital course: Induction of Labor With Vaginal Delivery   25y.o. yo [id number] admitted to the hospital 01/02/2021 for induction of labor.  Indication for induction: Elective.  Patient had an uncomplicated labor course as follows: Membrane Rupture Time/Date: 12:55 PM ,01/02/2021   Delivery Method:Vaginal, Spontaneous  Episiotomy: None  Lacerations:  1st degree  Details of delivery can be found in separate delivery note.  Patient had a routine postpartum course. Patient is discharged home 01/04/21.  Newborn Data: Birth date:01/02/2021  Birth time:8:02 PM  Gender:Female  Living status:Living  Apgars:8 ,9  Weight:3396 g   Magnesium Sulfate received: No BMZ received: No Rhophylac:N/A MMR:N/A T-DaP:offered prior to discharge Flu: offered prior to discharge Transfusion:No  Physical exam  Vitals:   01/03/21 1117 01/03/21 1353 01/03/21 2030 01/04/21 0539  BP: 106/67 99/64 105/69 110/76  Pulse: 61 65 78 68  Resp: 18 17 16 18   Temp: 97.7 F (36.5 C) 97.8 F (36.6 C) 98 F (36.7 C) 97.9 F (36.6 C)  TempSrc: Oral Oral Oral Oral  SpO2: 100%  99% 100%   General: alert, cooperative and no  distress Lochia: appropriate Uterine Fundus: firm Incision: N/A DVT Evaluation: No evidence of DVT seen on physical exam. No cords or calf tenderness. No significant calf/ankle edema. Labs: Lab Results  Component Value Date   WBC 13.3 (H) 01/02/2021   HGB 10.6 (L) 01/02/2021   HCT 31.7 (L) 01/02/2021   MCV 84.1 01/02/2021   PLT 124 (L) 01/02/2021   CMP Latest Ref Rng & Units 08/03/2020  Glucose 70 - 99 mg/dL 83  BUN 6 - 20 mg/dL 7  Creatinine 0.44 - 1.00 mg/dL 0.46  Sodium 135 - 145 mmol/L 136  Potassium 3.5 - 5.1 mmol/L 3.7  Chloride 98 - 111 mmol/L 105  CO2 22 - 32 mmol/L 22  Calcium 8.9 - 10.3 mg/dL 9.4  Total Protein 6.5 - 8.1 g/dL 6.5  Total Bilirubin 0.3 - 1.2 mg/dL 0.8  Alkaline Phos 38 - 126 U/L 49  AST 15 - 41 U/L 16  ALT 0 - 44 U/L 18   Edinburgh Score: Edinburgh Postnatal Depression Scale Screening Tool 01/03/2021  I have been able to laugh and see the funny side of things. 0  I have looked forward with enjoyment to things. 0  I have blamed myself unnecessarily when things went wrong. 0  I have been anxious or worried for no good reason. 0  I have felt scared or panicky for no good reason. 0  Things have been getting on top of me. 0  I have  been so unhappy that I have had difficulty sleeping. 0  I have felt sad or miserable. 0  I have been so unhappy that I have been crying. 0  The thought of harming myself has occurred to me. 0  Edinburgh Postnatal Depression Scale Total 0     After visit meds:  Allergies as of 01/04/2021   No Known Allergies     Medication List    STOP taking these medications   Blood Pressure Kit   Comfort Fit Maternity Supp Med Misc   ferrous sulfate 325 (65 FE) MG tablet Commonly known as: FerrouSul     TAKE these medications   acetaminophen 500 MG tablet Commonly known as: TYLENOL Take 2 tablets (1,000 mg total) by mouth every 8 (eight) hours as needed.   coconut oil Oil Apply 1 application topically as needed (nipple  pain).   [employer] PRENATAL GUMMY PO Take 2 tablets by mouth daily.   ibuprofen 600 MG tablet Commonly known as: ADVIL Take 1 tablet (600 mg total) by mouth every 6 (six) hours.   oxyCODONE 5 MG immediate release tablet Commonly known as: Oxy IR/ROXICODONE Take 1 tablet (5 mg total) by mouth every 6 (six) hours as needed for breakthrough pain.        Discharge home in stable condition Infant Feeding: Breast Infant Disposition:home with mother Discharge instruction: per After Visit Summary and Postpartum booklet. Activity: Advance as tolerated. Pelvic rest for 6 weeks.  Diet: routine diet Future Appointments: Future Appointments  Date Time Provider [place]  02/12/2021 10:15 AM Ephraim Hamburger, Rona Ravens, NP [place] None   Follow up Visit: Message sent to [facility] clinic by Dr. Astrid Drafts.  Please schedule this patient for a In person postpartum visit in 6 weeks with the following provider: Any provider. Additional Postpartum F/U:none  High risk pregnancy complicated by: gestational thrombocytopenia, muscular dystrophy carrier Delivery mode:  Vaginal, Spontaneous  Anticipated Birth Control:  Vanessa , Gildardo Cranker, MD OB Fellow, Faculty Practice 01/04/2021 9:01 AM

## 2021-01-02 NOTE — Anesthesia Preprocedure Evaluation (Signed)
Anesthesia Evaluation  Patient identified by MRN, date of birth, ID band Patient awake    Reviewed: Allergy & Precautions, NPO status , Patient's Chart, lab work & pertinent test results  Airway Mallampati: II  TM Distance: >3 FB Neck ROM: Full    Dental  (+) Teeth Intact, Dental Advisory Given   Pulmonary neg pulmonary ROS,    Pulmonary exam normal breath sounds clear to auscultation       Cardiovascular negative cardio ROS Normal cardiovascular exam Rhythm:Regular Rate:Normal     Neuro/Psych negative neurological ROS     GI/Hepatic negative GI ROS, Neg liver ROS,   Endo/Other  Obesity   Renal/GU negative Renal ROS     Musculoskeletal negative musculoskeletal ROS (+)   Abdominal   Peds  Hematology  (+) Blood dyscrasia (Plt 130k), anemia ,   Anesthesia Other Findings Day of surgery medications reviewed with the patient.  Reproductive/Obstetrics (+) Pregnancy                             Anesthesia Physical Anesthesia Plan  ASA: II  Anesthesia Plan: Epidural   Post-op Pain Management:    Induction:   PONV Risk Score and Plan: 2  Airway Management Planned: Natural Airway  Additional Equipment:   Intra-op Plan:   Post-operative Plan:   Informed Consent: I have reviewed the patients History and Physical, chart, labs and discussed the procedure including the risks, benefits and alternatives for the proposed anesthesia with the patient or authorized representative who has indicated his/her understanding and acceptance.     Dental advisory given  Plan Discussed with:   Anesthesia Plan Comments: (Patient identified. Risks/Benefits/Options discussed with patient including but not limited to bleeding, infection, nerve damage, paralysis, failed block, incomplete pain control, headache, blood pressure changes, nausea, vomiting, reactions to medication both or allergic, itching and  postpartum back pain. Confirmed with bedside nurse the patient's most recent platelet count. Confirmed with patient that they are not currently taking any anticoagulation, have any bleeding history or any family history of bleeding disorders. Patient expressed understanding and wished to proceed. All questions were answered. )        Anesthesia Quick Evaluation
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
## 2021-01-02 NOTE — Discharge Instructions (Signed)

## 2021-01-02 NOTE — Anesthesia Procedure Notes (Signed)
Epidural Patient location during procedure: OB Start time: 01/02/2021 12:20 PM End time: 01/02/2021 12:26 PM  Staffing Anesthesiologist: Cecile Hearing, MD Performed: anesthesiologist   Preanesthetic Checklist Completed: patient identified, IV checked, risks and benefits discussed, monitors and equipment checked, pre-op evaluation and timeout performed  Epidural Patient position: sitting Prep: DuraPrep Patient monitoring: blood pressure and continuous pulse ox Approach: midline Location: L3-L4 Injection technique: LOR air  Needle:  Needle type: Tuohy  Needle gauge: 17 G Needle length: 9 cm Needle insertion depth: 5 cm Catheter size: 19 Gauge Catheter at skin depth: 10 cm Test dose: negative and Other (1% Lidocaine)  Additional Notes Patient identified.  Risk benefits discussed including failed block, incomplete pain control, headache, nerve damage, paralysis, blood pressure changes, nausea, vomiting, reactions to medication both toxic or allergic, and postpartum back pain.  Patient expressed understanding and wished to proceed.  All questions were answered.  Sterile technique used throughout procedure and epidural site dressed with sterile barrier dressing. No paresthesia or other complications noted. The patient did not experience any signs of intravascular injection such as tinnitus or metallic taste in mouth nor signs of intrathecal spread such as rapid motor block. Please see nursing notes for vital signs. Reason for block:procedure for pain

## 2021-01-02 NOTE — Lactation Note (Signed)
This note was copied from a baby's chart. Lactation Consultation Note  Patient Name: Lindsay Whitney ELFYB'O Date: 01/02/2021 Reason for consult: L&D Initial assessment Age:27 hours Mother bf p independently p delivery. She denies questions/concerns. She is aware of LC services and declines further routine consults. Maternal Data Does the patient have breastfeeding experience prior to this delivery?: Yes How long did the patient breastfeed?: 3 mo  Feeding Mother's Current Feeding Choice: Breast Milk  Consult Status Consult Status: Complete (mother declined follow up)  Elder Negus, MA IBCLC 01/02/2021, 9:45 PM

## 2021-01-02 NOTE — Progress Notes (Signed)
Labor Progress Note Lindsay Whitney is a 25 y.o. [id number] at [redacted]w[redacted]d presented for elective IOL  S:  Patient now comfortable with epidural  O:  BP 102/87   Pulse 63   Temp 97.7 F (36.5 C) (Oral)   Resp 18   LMP 04/04/2020   Fetal Tracing:  Baseline: 135 Variability: moderate Accels: 15x15 Decels: none  Toco: 1-3   CVE: Dilation: 4 Effacement (%): 60 Cervical Position: Middle Station: -1 Presentation: Vertex Exam by:: Cleone Slim, CNM   A&P: 25 y.o. [id number] [redacted]w[redacted]d elective IOL #Labor: Progressing well. Discussed with patient risks and benefits of AROM for augmentation of labor. Patient agreeable to plan of care. AROM with large amount of clear fluid. Patient and FHR tolerated procedure well. Patient contracting regularly, will place pitocin orders for if contractions space out. Will recheck in 2-3 hours or sooner if needed #Pain: epidural #FWB: Cat 1 #GBS negative  Rolm Bookbinder, CNM

## 2021-01-03 MED ORDER — OXYCODONE HCL 5 MG PO TABS
5.0000 mg | ORAL_TABLET | Freq: Four times a day (QID) | ORAL | Status: DC | PRN
  Administered 2021-01-03 (×2): 5 mg via ORAL
  Filled 2021-01-03 (×2): qty 1

## 2021-01-03 MED ORDER — ACETAMINOPHEN 500 MG PO TABS
1000.0000 mg | ORAL_TABLET | Freq: Four times a day (QID) | ORAL | Status: DC
  Administered 2021-01-03 – 2021-01-04 (×5): 1000 mg via ORAL
  Filled 2021-01-03 (×5): qty 2

## 2021-01-03 NOTE — Anesthesia Postprocedure Evaluation (Signed)
Anesthesia Post Note  Patient: Lindsay Whitney  Procedure(s) Performed: AN AD HOC LABOR EPIDURAL     Patient location during evaluation: Mother Baby Anesthesia Type: Epidural Level of consciousness: awake and alert Pain management: pain level controlled Vital Signs Assessment: post-procedure vital signs reviewed and stable Respiratory status: spontaneous breathing, nonlabored ventilation and respiratory function stable Cardiovascular status: stable Postop Assessment: no headache, no backache and epidural receding Anesthetic complications: no   No complications documented.  Last Vitals:  Vitals:   01/03/21 0335 01/03/21 0752  BP: 107/65 104/63  Pulse: 73 69  Resp: 16 17  Temp: 36.7 C 36.7 C  SpO2: 98% 99%    Last Pain:  Vitals:   01/03/21 0752  TempSrc: Axillary  PainSc:    Pain Goal:                   [facility]ica Records

## 2021-01-03 NOTE — Progress Notes (Addendum)
POSTPARTUM PROGRESS NOTE  Subjective: Lindsay Whitney is a 25 y.o. [id number] on postpartum day #1 s/p vaginal delivery at 102w0d.  She reports she's doing well overall. No acute events overnight. She denies any problems with ambulating, voiding or po intake. Denies nausea or vomiting. She has passed flatus. Pain is moderately controlled.  Lochia is appropriate.  Objective: Blood pressure 107/65, pulse 73, temperature 98 F (36.7 C), temperature source Axillary, resp. rate 16, last menstrual period 04/04/2020, SpO2 98 %, unknown if currently breastfeeding.  Physical Exam:  General: alert, cooperative and no distress Chest: no respiratory distress Abdomen: soft, appropriately tender Uterine Fundus: firm and at level of umbilicus Extremities: No calf swelling or tenderness  Recent Labs    01/02/21 0759 01/02/21 2058  HGB 10.7* 10.6*  HCT 32.8* 31.7*    Assessment/Plan: Lindsay Whitney is a 25 y.o. [id number] on PPD#1 s/p vaginal delivery at [redacted]w[redacted]d. She had an elective induction due to pubic symphysis pain.  Routine Postpartum Care: Doing well, pain moderately controlled.  -- Increase Tylenol to 1000mg  q6h, continue Ibuprofen 600mg  q6h -- Continue routine care -- Contraception: POPs -- Feeding: Breast, lactation support prn -- Circ: desired, plan for circ prior to d/c, consented  Gestational thrombocytopenia: Plt stable at 124 (130 on admission). Lochia appropriate. No other bleeding. Anemia: Hgb stable at 10.6 (10.7 on admission). Asymptomatic. Muscular Dystrophy Carrier: FOB negative.  Dispo: Plan for discharge home tomorrow.  , MD PGY-1 Family Medicine 01/03/2021 5:28 AM   GME ATTESTATION:  I saw and evaluated the patient. I agree with the findings and the plan of care as documented in the resident's note.  Maury Dus, MD OB Fellow, Faculty [facility], Center for [facility] 01/03/2021 7:43 AM
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
## 2021-01-04 ENCOUNTER — Encounter: Payer: Medicaid Other | Admitting: Obstetrics and Gynecology

## 2021-01-04 MED ORDER — OXYCODONE HCL 5 MG PO TABS
5.0000 mg | ORAL_TABLET | Freq: Four times a day (QID) | ORAL | 0 refills | Status: DC | PRN
Start: 2021-01-04 — End: 2022-08-24

## 2021-01-04 MED ORDER — IBUPROFEN 600 MG PO TABS: 600.0000 mg | ORAL_TABLET | Freq: Four times a day (QID) | ORAL | 0 refills | Status: DC

## 2021-01-04 MED ORDER — ACETAMINOPHEN 500 MG PO TABS: 1000.0000 mg | ORAL_TABLET | Freq: Three times a day (TID) | ORAL | 0 refills | Status: DC | PRN

## 2021-01-04 MED ORDER — COCONUT OIL OIL: 1.0000 "application " | TOPICAL_OIL | 0 refills | Status: DC | PRN

## 2021-02-12 ENCOUNTER — Other Ambulatory Visit: Payer: Self-pay

## 2021-02-12 ENCOUNTER — Encounter: Payer: Self-pay | Admitting: Obstetrics

## 2021-02-12 ENCOUNTER — Ambulatory Visit (INDEPENDENT_AMBULATORY_CARE_PROVIDER_SITE_OTHER): Payer: Medicaid Other | Admitting: Obstetrics

## 2021-02-12 DIAGNOSIS — Z3009 Encounter for other general counseling and advice on contraception: Secondary | ICD-10-CM

## 2021-02-12 DIAGNOSIS — Z30011 Encounter for initial prescription of contraceptive pills: Secondary | ICD-10-CM

## 2021-02-12 MED ORDER — NORETHINDRONE 0.35 MG PO TABS: 1.0000 | ORAL_TABLET | Freq: Every day | ORAL | 11 refills | Status: DC

## 2021-02-12 NOTE — Progress Notes (Signed)
Post Partum Visit Note  Lindsay Whitney is a 25 y.o. [phone] female who presents for a postpartum visit. She is 6 weeks postpartum following a normal spontaneous vaginal delivery.  I have fully reviewed the prenatal and intrapartum course. The delivery was at 39 gestational weeks.  Anesthesia: epidural. Postpartum course has been unremarkable. Baby is doing well. Baby is feeding by breast. Bleeding no bleeding. Bowel function is normal. Bladder function is normal. Patient is not sexually active. Contraception method is OCP (estrogen/progesterone). Postpartum depression screening: negative. Score 2   The pregnancy intention screening data noted above was reviewed. Potential methods of contraception were discussed. The patient elected to proceed with Oral Contraceptive.    Edinburgh Postnatal Depression Scale - 02/12/21 1033      Edinburgh Postnatal Depression Scale:  In the Past 7 Days   I have been able to laugh and see the funny side of things. 0    I have looked forward with enjoyment to things. 0    I have blamed myself unnecessarily when things went wrong. 0    I have been anxious or worried for no good reason. 2    I have felt scared or panicky for no good reason. 0    Things have been getting on top of me. 0    I have been so unhappy that I have had difficulty sleeping. 0    I have felt sad or miserable. 0    I have been so unhappy that I have been crying. 0    The thought of harming myself has occurred to me. 0    Edinburgh Postnatal Depression Scale Total 2            The following portions of the patient's history were reviewed and updated as appropriate: allergies, current medications, past family history, past medical history, past social history, past surgical history and problem list.  Review of Systems A comprehensive review of systems was negative.    Objective:  BP 111/63 (BP Location: Right Arm, Patient Position: Sitting, Cuff Size: Normal)   Pulse 71   Ht 5\' 1"   (1.549 m)   Wt 153 lb (69.4 kg)   Breastfeeding Yes   BMI 28.91 kg/m    General:  alert and no distress   Breasts:  inspection negative, no nipple discharge or bleeding, no masses or nodularity palpable  Lungs: clear to auscultation bilaterally  Heart:  regular rate and rhythm, S1, S2 normal, no murmur, click, rub or gallop     Assessment:    1. Postpartum care following vaginal delivery - doing wel  2. Mother currently breast-feeding  3. Encounter for other general counseling or advice on contraception - wants OCP's  4. Encounter for initial prescription of contraceptive pills Rx: - norethindrone (MICRONOR) 0.35 MG tablet; Take 1 tablet (0.35 mg total) by mouth daily.  Dispense: 28 tablet; Refill: 11   Plan:   Essential components of care per ACOG recommendations:  1.  Mood and well being: Patient with negative depression screening today.  - Patient does not use tobacco.  - hx of drug use? No   2. Infant care and feeding:  -Patient currently breastmilk feeding? Yes If breastmilk feeding discussed return to work and pumping. If needed, patient was provided letter for work to allow for every 2-3 hr pumping breaks, and to be granted a private location to express breastmilk and refrigerated area to store breastmilk. Reviewed importance of draining breast regularly to support lactation. -Social  determinants of health (SDOH) reviewed in EPIC. No concerns  3. Sexuality, contraception and birth spacing - Patient does not want a pregnancy in the next year.  Desired family size is 3 children.  - Reviewed forms of contraception in tiered fashion. Patient desired oral progesterone-only contraceptive today.   - Discussed birth spacing of 18 months  4. Sleep and fatigue -Encouraged family/partner/community support of 4 hrs of uninterrupted sleep to help with mood and fatigue  5. Physical Recovery  - Discussed patients delivery, and there were no complications - Patient had a 1st  degree perinea laceration, perineal healing reviewed. Patient expressed understanding - Patient has urinary incontinence? No - Patient is safe to resume physical and sexual activity  6.  Health Maintenance - Last pap smear done 02-21-2020 and was normal with negative HPV.  7. No Chronic Disease   Coral Ceo, MD [facility] 02/12/21

## 2021-03-12 ENCOUNTER — Ambulatory Visit: Payer: Medicaid Other | Admitting: Obstetrics

## 2021-03-15 IMAGING — US US RENAL
1 series · 15 of 25 positions shown · non-contrast
Comparison: Renal protocol CT 09/30/2019

CLINICAL DATA: Back pain.  History of kidney stones.

EXAM:
RENAL / URINARY TRACT ULTRASOUND COMPLETE

[Series 1: us renal · 15 of 37 slices shown]
[im 1/37]
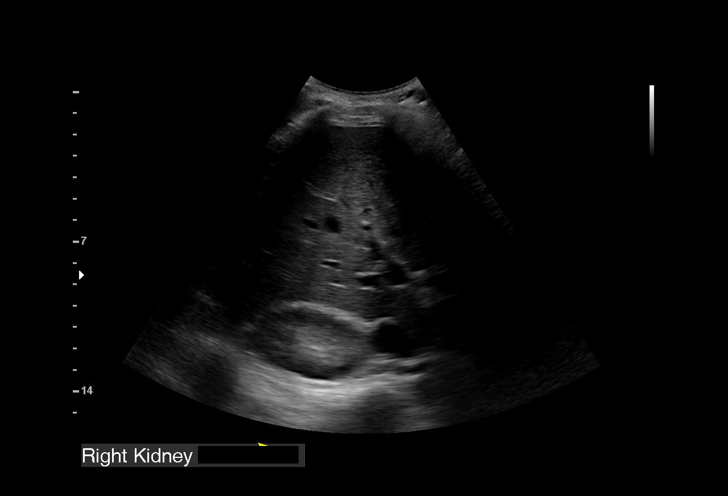
[im 4/37]
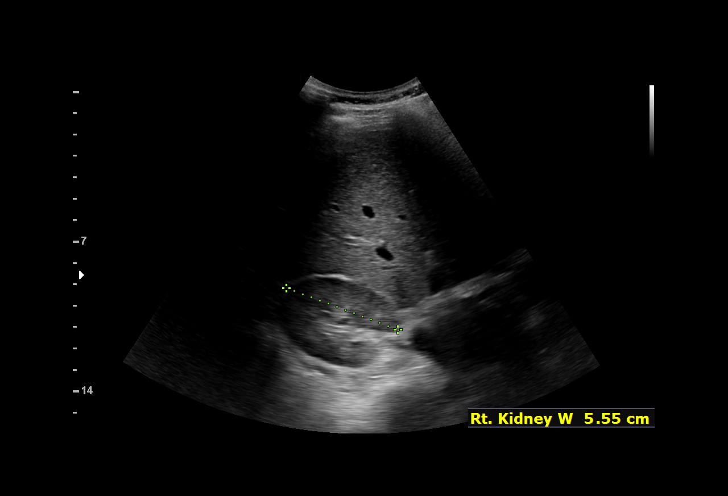
[im 7/37]
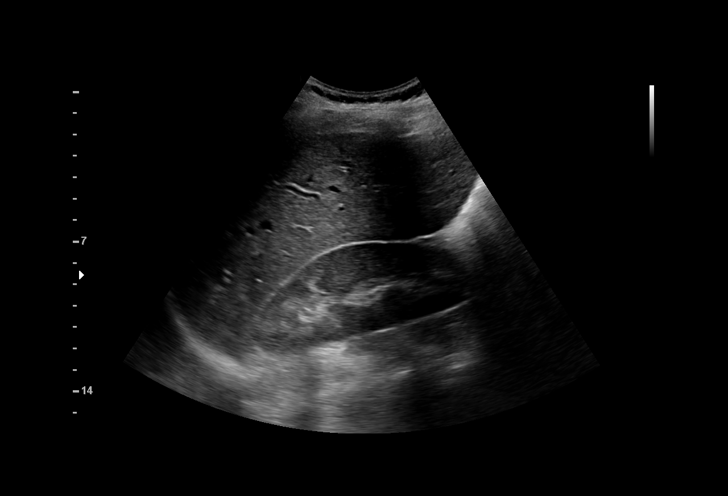
[im 8/37]
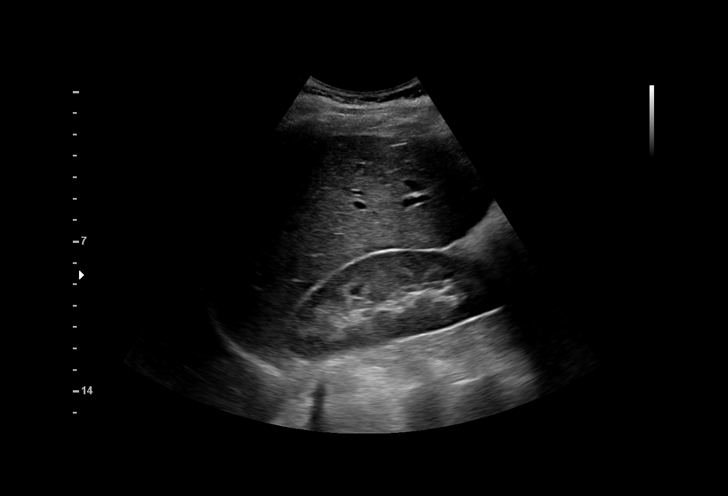
[im 11/37]
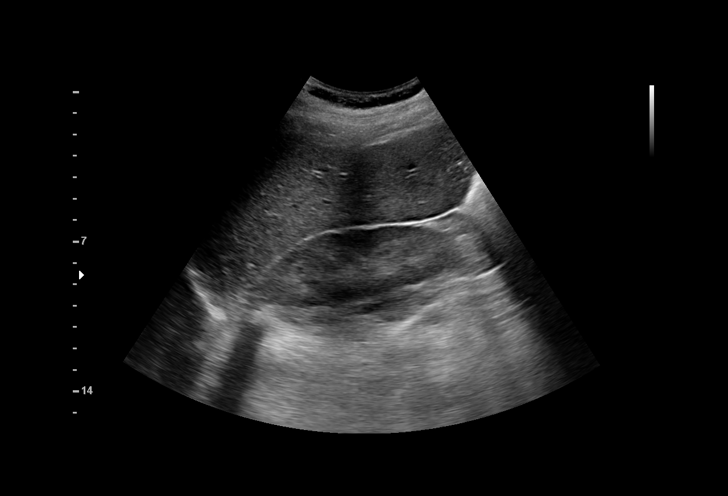
[im 14/37]
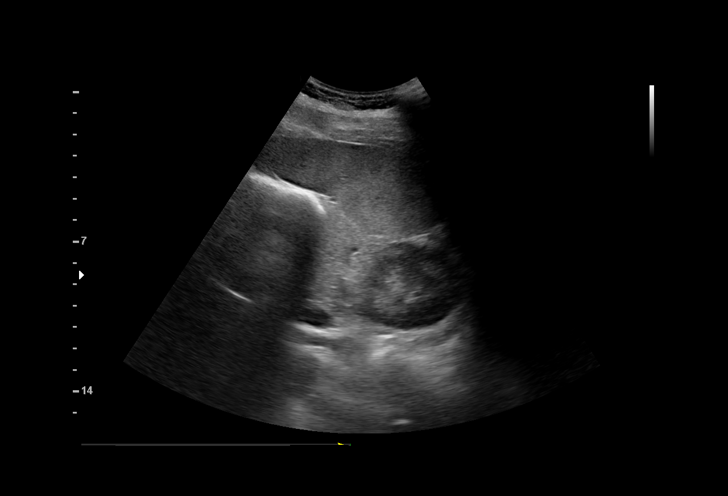
[im 16/37]
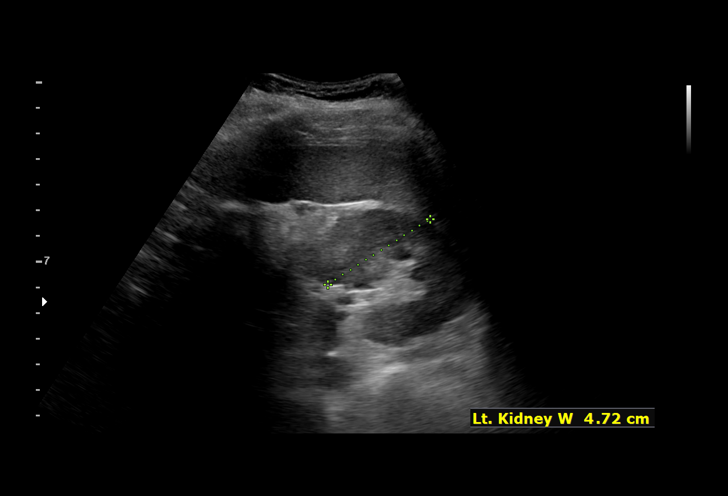
[im 19/37]
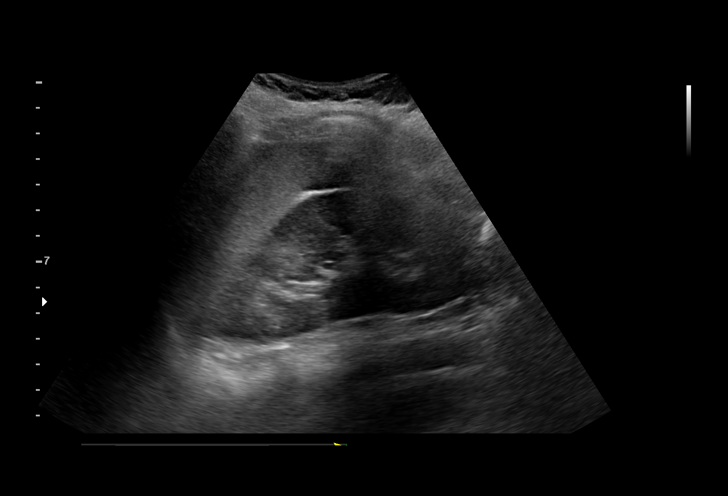
[im 22/37]
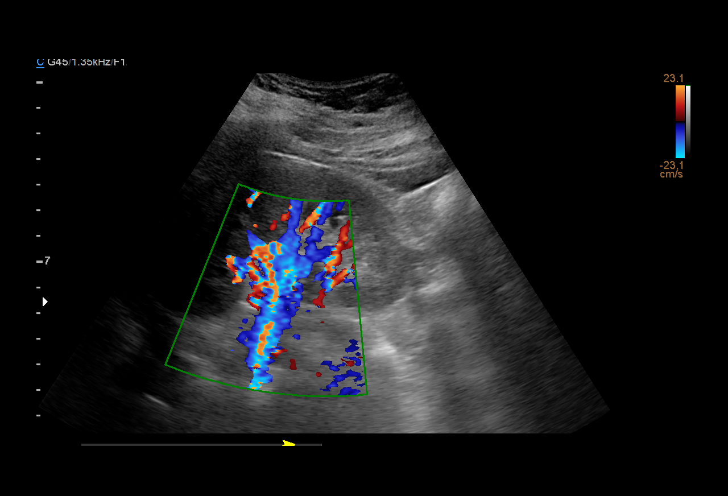
[im 23/37]
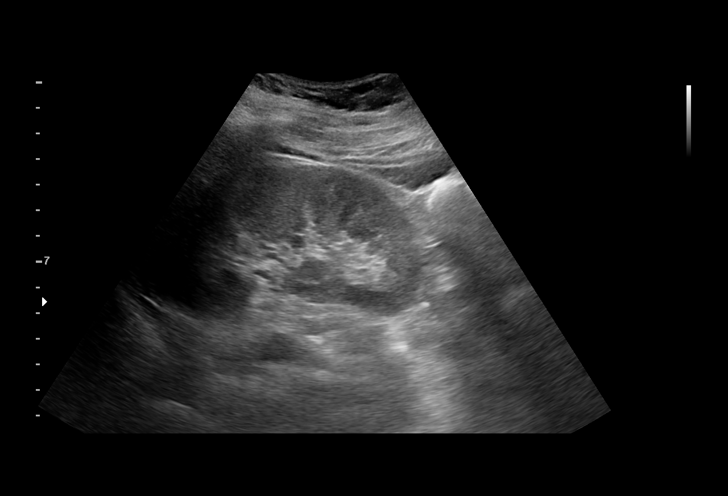
[im 26/37]
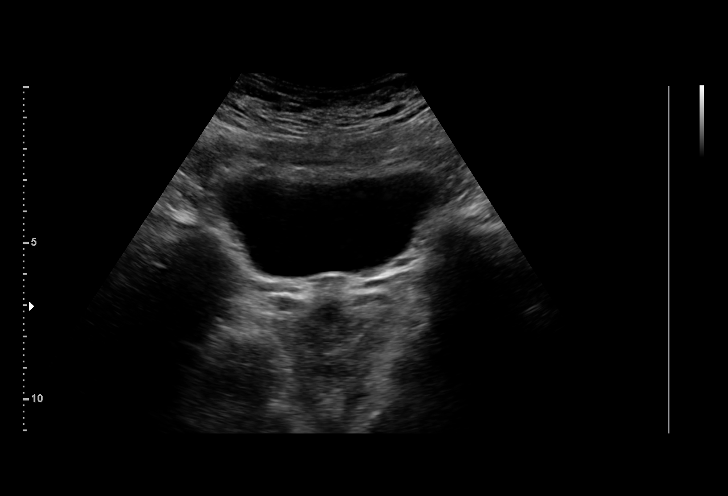
[im 29/37]
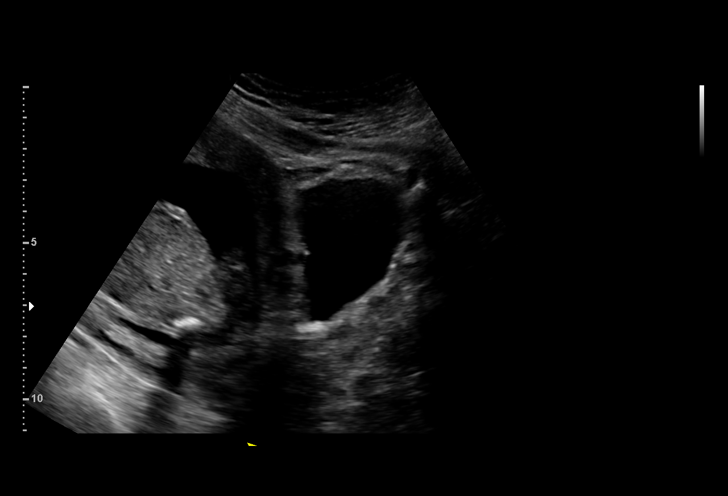
[im 31/37]
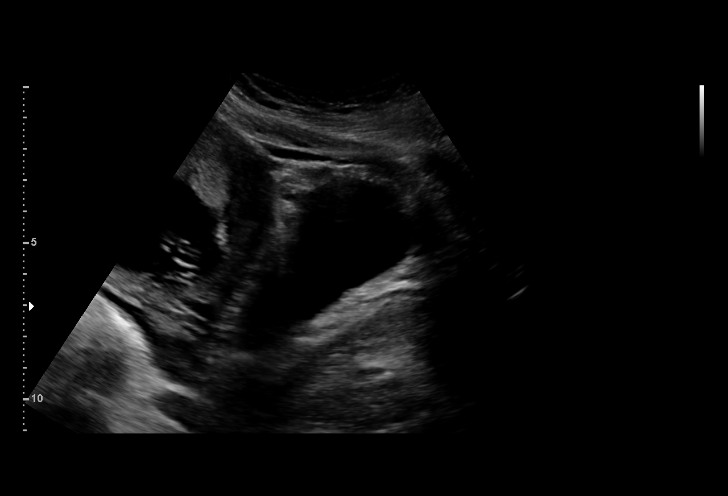
[im 34/37]
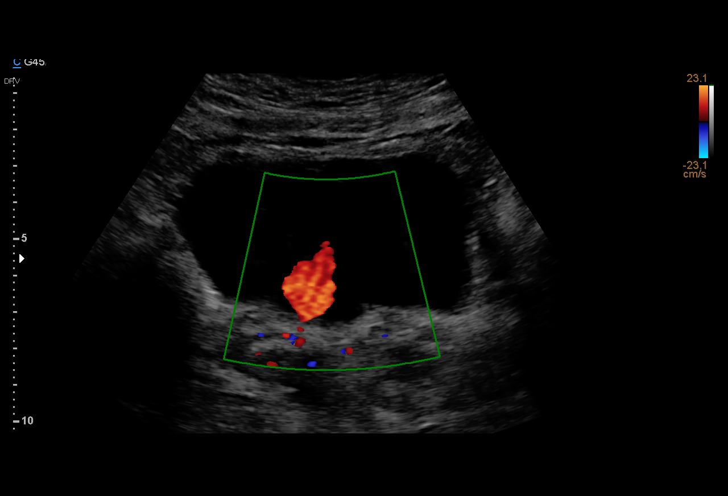
[im 37/37]
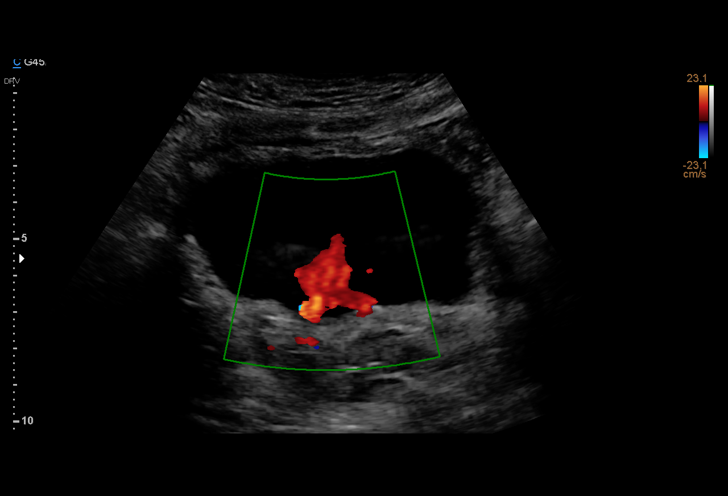

[15 of 25 positions shown; findings below may reference images not displayed]

FINDINGS: Right Kidney:

Renal measurements: 11.2 x 4.3 x 5.6 = volume: 138 mL. Echogenicity
within normal limits. No mass or hydronephrosis visualized.

Left Kidney:

Renal measurements: 9.1 x 5.0 x 4.7 = volume: 139 mL. Echogenicity
within normal limits. No mass or hydronephrosis visualized.

Bladder:

Appears normal for degree of bladder distention.
IMPRESSION: No evidence of acute abnormality. No hydronephrosis. No calculi
identified.

## 2021-08-17 ENCOUNTER — Other Ambulatory Visit (INDEPENDENT_AMBULATORY_CARE_PROVIDER_SITE_OTHER): Payer: Medicaid Other | Admitting: Advanced Practice Midwife

## 2021-08-17 DIAGNOSIS — Z30011 Encounter for initial prescription of contraceptive pills: Secondary | ICD-10-CM

## 2021-08-17 MED ORDER — DROSPIRENONE-ETHINYL ESTRADIOL 3-0.02 MG PO TABS: 1.0000 | ORAL_TABLET | Freq: Every day | ORAL | 11 refills | Status: DC

## 2021-08-17 NOTE — Progress Notes (Signed)
Pt sent MyChart message that she was no longer breastfeeding and wanted to switch from POPs to COC/OCPs. Mychart message sent to pt and risks of COCs with estrogen reviewed.  Pt with no additional risk factors, is a nonsmoker under age 27.  Rx for Yaz, which pt used in the past, sent to pt pharmacy.

## 2022-06-29 ENCOUNTER — Ambulatory Visit (INDEPENDENT_AMBULATORY_CARE_PROVIDER_SITE_OTHER): Payer: Medicaid Other | Admitting: *Deleted

## 2022-06-29 VITALS — BP 120/81 | HR 75 | Ht 61.0 in | Wt 148.4 lb

## 2022-06-29 DIAGNOSIS — Z3201 Encounter for pregnancy test, result positive: Secondary | ICD-10-CM | POA: Diagnosis not present

## 2022-06-29 LAB — POCT URINE PREGNANCY: Preg Test, Ur: POSITIVE — AB

## 2022-06-29 NOTE — Progress Notes (Cosign Needed)
Lindsay Whitney presents today for UPT. She has no unusual complaints. LMP: Unknown    OBJECTIVE: Appears well, in no apparent distress.  OB History     Gravida  4   Para  3   Term  3   Preterm      AB  1   Living  3      SAB  1   IAB      Ectopic      Multiple  0   Live Births  3          Home UPT Result: positive In-Office UPT result: positive I have reviewed the patient's medical, obstetrical, social, and family histories, and medications.   ASSESSMENT: Positive pregnancy test  PLAN Prenatal care to be completed at: [facility]  Intake and viability/ dating scan scheduled at checkout. Pt declines PNV RX, will get OTC.

## 2022-07-06 NOTE — Progress Notes (Signed)
Patient was assessed and managed by nursing staff during this encounter. I have reviewed the chart and agree with the documentation and plan. I have also made any necessary editorial changes.  Terry Bolotin, MD 07/06/2022 2:26 PM   

## 2022-08-04 ENCOUNTER — Ambulatory Visit (INDEPENDENT_AMBULATORY_CARE_PROVIDER_SITE_OTHER): Payer: Medicaid Other

## 2022-08-04 DIAGNOSIS — O219 Vomiting of pregnancy, unspecified: Secondary | ICD-10-CM

## 2022-08-04 DIAGNOSIS — Z3482 Encounter for supervision of other normal pregnancy, second trimester: Secondary | ICD-10-CM | POA: Diagnosis not present

## 2022-08-04 DIAGNOSIS — Z3A15 15 weeks gestation of pregnancy: Secondary | ICD-10-CM

## 2022-08-04 DIAGNOSIS — Z3480 Encounter for supervision of other normal pregnancy, unspecified trimester: Secondary | ICD-10-CM

## 2022-08-04 MED ORDER — DICLEGIS 10-10 MG PO TBEC: 2.0000 | DELAYED_RELEASE_TABLET | Freq: Every day | ORAL | 5 refills | Status: DC
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
## 2022-08-05 NOTE — Progress Notes (Signed)
Patient was assessed and managed by nursing staff during this encounter. I have reviewed the chart and agree with the documentation and plan. I have also made any necessary editorial changes.  Jazari Ober A Elie Leppo, MD 08/05/2022 9:26 AM   

## 2022-08-24 ENCOUNTER — Encounter: Payer: Self-pay | Admitting: Obstetrics and Gynecology

## 2022-08-24 ENCOUNTER — Other Ambulatory Visit (HOSPITAL_COMMUNITY)
Admission: RE | Admit: 2022-08-24 | Discharge: 2022-08-24 | Disposition: A | Discharge status: Home / Self Care | Payer: Medicaid Other | Source: Ambulatory Visit | Attending: Obstetrics and Gynecology | Admitting: Obstetrics and Gynecology

## 2022-08-24 ENCOUNTER — Ambulatory Visit (INDEPENDENT_AMBULATORY_CARE_PROVIDER_SITE_OTHER): Payer: Medicaid Other | Admitting: Obstetrics and Gynecology

## 2022-08-24 VITALS — BP 112/76 | HR 77 | Wt 145.5 lb

## 2022-08-24 DIAGNOSIS — Z3480 Encounter for supervision of other normal pregnancy, unspecified trimester: Secondary | ICD-10-CM

## 2022-08-24 DIAGNOSIS — Z3482 Encounter for supervision of other normal pregnancy, second trimester: Secondary | ICD-10-CM

## 2022-08-24 DIAGNOSIS — Z3A17 17 weeks gestation of pregnancy: Secondary | ICD-10-CM | POA: Diagnosis not present

## 2022-08-24 NOTE — Addendum Note (Signed)
Addended by: Lewie Loron D on: 08/24/2022 11:03 AM   Modules accepted: Orders

## 2022-08-24 NOTE — Patient Instructions (Signed)

## 2022-08-24 NOTE — Progress Notes (Signed)
New OB Pt reports unsure of feeling fetal movement, denies pain. NOB intake and U/S completed on 08/04/22. Pt declines pap today and requests STD testing on urine
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
## 2022-08-25 LAB — URINE CYTOLOGY ANCILLARY ONLY
Bacterial Vaginitis-Urine: NEGATIVE
Candida Urine: NEGATIVE — AB
Candida Urine: POSITIVE — AB
Chlamydia: NEGATIVE
Comment: NEGATIVE
Comment: NEGATIVE
Comment: NORMAL
Neisseria Gonorrhea: NEGATIVE
Trichomonas: NEGATIVE

## 2022-08-25 LAB — CBC/D/PLT+RPR+RH+ABO+RUBIGG...
Antibody Screen: NEGATIVE
Basophils Absolute: 0.1 10*3/uL (ref 0.0–0.2)
Basos: 1 %
EOS (ABSOLUTE): 0.3 10*3/uL (ref 0.0–0.4)
Eos: 4 %
HCV Ab: NONREACTIVE
HIV Screen 4th Generation wRfx: NONREACTIVE
Hematocrit: 36.2 % (ref 34.0–46.6)
Hemoglobin: 12.4 g/dL (ref 11.1–15.9)
Hepatitis B Surface Ag: NEGATIVE
Immature Grans (Abs): 0 10*3/uL (ref 0.0–0.1)
Immature Granulocytes: 0 %
Lymphocytes Absolute: 1.9 10*3/uL (ref 0.7–3.1)
Lymphs: 25 %
MCH: 30.8 pg (ref 26.6–33.0)
MCHC: 34.3 g/dL (ref 31.5–35.7)
MCV: 90 fL (ref 79–97)
Monocytes Absolute: 0.5 10*3/uL (ref 0.1–0.9)
Monocytes: 7 %
Neutrophils Absolute: 4.7 10*3/uL (ref 1.4–7.0)
Neutrophils: 63 %
Platelets: 211 10*3/uL (ref 150–450)
RBC: 4.02 x10E6/uL (ref 3.77–5.28)
RDW: 12.8 % (ref 11.7–15.4)
RPR Ser Ql: NONREACTIVE
Rh Factor: POSITIVE
Rubella Antibodies, IGG: 1.77 index (ref >0.99)
WBC: 7.5 10*3/uL (ref 3.4–10.8)

## 2022-08-25 LAB — HEMOGLOBIN A1C
Est. average glucose Bld gHb Est-mCnc: 94 mg/dL
Hgb A1c MFr Bld: 4.9 % (ref 4.8–5.6)

## 2022-08-25 LAB — HCV INTERPRETATION

## 2022-08-26 LAB — CULTURE, OB URINE

## 2022-08-26 LAB — URINE CULTURE, OB REFLEX

## 2022-08-29 LAB — PANORAMA PRENATAL TEST FULL PANEL:PANORAMA TEST PLUS 5 ADDITIONAL MICRODELETIONS: FETAL FRACTION: 12.1

## 2022-08-31 ENCOUNTER — Other Ambulatory Visit: Payer: Self-pay | Admitting: *Deleted

## 2022-08-31 DIAGNOSIS — Z3480 Encounter for supervision of other normal pregnancy, unspecified trimester: Secondary | ICD-10-CM

## 2022-08-31 NOTE — Progress Notes (Signed)
U/s order changed to detail per MFM.

## 2022-09-02 LAB — HORIZON CUSTOM

## 2022-09-05 ENCOUNTER — Ambulatory Visit: Payer: Medicaid Other | Admitting: *Deleted

## 2022-09-05 ENCOUNTER — Ambulatory Visit: Payer: Medicaid Other | Attending: Obstetrics and Gynecology

## 2022-09-05 ENCOUNTER — Other Ambulatory Visit: Payer: Self-pay | Admitting: Obstetrics and Gynecology

## 2022-09-05 ENCOUNTER — Other Ambulatory Visit: Payer: Self-pay | Admitting: *Deleted

## 2022-09-05 ENCOUNTER — Encounter: Payer: Self-pay | Admitting: *Deleted

## 2022-09-05 VITALS — BP 110/65 | HR 83

## 2022-09-05 DIAGNOSIS — D649 Anemia, unspecified: Secondary | ICD-10-CM

## 2022-09-05 DIAGNOSIS — Z3689 Encounter for other specified antenatal screening: Secondary | ICD-10-CM

## 2022-09-05 DIAGNOSIS — Z3480 Encounter for supervision of other normal pregnancy, unspecified trimester: Secondary | ICD-10-CM

## 2022-09-05 DIAGNOSIS — Z148 Genetic carrier of other disease: Secondary | ICD-10-CM | POA: Insufficient documentation

## 2022-09-05 DIAGNOSIS — Z363 Encounter for antenatal screening for malformations: Secondary | ICD-10-CM | POA: Diagnosis present

## 2022-09-05 DIAGNOSIS — O285 Abnormal chromosomal and genetic finding on antenatal screening of mother: Secondary | ICD-10-CM | POA: Diagnosis not present

## 2022-09-05 DIAGNOSIS — Z3A15 15 weeks gestation of pregnancy: Secondary | ICD-10-CM | POA: Diagnosis not present

## 2022-09-05 DIAGNOSIS — O321XX0 Maternal care for breech presentation, not applicable or unspecified: Secondary | ICD-10-CM | POA: Insufficient documentation

## 2022-09-05 DIAGNOSIS — O99012 Anemia complicating pregnancy, second trimester: Secondary | ICD-10-CM | POA: Diagnosis not present

## 2022-09-05 DIAGNOSIS — Z362 Encounter for other antenatal screening follow-up: Secondary | ICD-10-CM

## 2022-09-21 ENCOUNTER — Encounter: Payer: Self-pay | Admitting: Obstetrics and Gynecology

## 2022-09-21 ENCOUNTER — Encounter: Payer: Medicaid Other | Admitting: Obstetrics & Gynecology

## 2022-10-12 ENCOUNTER — Ambulatory Visit: Payer: Medicaid Other | Attending: Obstetrics and Gynecology

## 2022-10-12 ENCOUNTER — Ambulatory Visit: Payer: Medicaid Other | Admitting: *Deleted

## 2022-10-12 VITALS — BP 108/62 | HR 90

## 2022-10-12 DIAGNOSIS — O99891 Other specified diseases and conditions complicating pregnancy: Secondary | ICD-10-CM | POA: Diagnosis not present

## 2022-10-12 DIAGNOSIS — Z148 Genetic carrier of other disease: Secondary | ICD-10-CM | POA: Diagnosis not present

## 2022-10-12 DIAGNOSIS — Z3A20 20 weeks gestation of pregnancy: Secondary | ICD-10-CM

## 2022-10-12 DIAGNOSIS — Z362 Encounter for other antenatal screening follow-up: Secondary | ICD-10-CM

## 2022-10-12 DIAGNOSIS — Z363 Encounter for antenatal screening for malformations: Secondary | ICD-10-CM | POA: Diagnosis not present

## 2022-10-12 DIAGNOSIS — O358XX0 Maternal care for other (suspected) fetal abnormality and damage, not applicable or unspecified: Secondary | ICD-10-CM

## 2022-11-02 ENCOUNTER — Telehealth: Payer: Self-pay | Admitting: *Deleted

## 2022-11-02 NOTE — Telephone Encounter (Signed)
Telephone call to patient to get her scheduled.  She has had a lapse in care.  Left voicemail for her to call [phone].

## 2022-11-07 NOTE — L&D Delivery Note (Addendum)
OB/GYN Faculty Practice Delivery Note  Lindsay Whitney is a 27 y.o. [id number] s/p VD at [redacted]w[redacted]d. She was admitted for early active labor with known history of precipitous delivery.   ROM: 6h 98m with clear fluid GBS Status: Positive Maximum Maternal Temperature: 98  Labor Progress: Patient presented for early active labor and was augmented with pitocin and AROM after adequate PCN dosing.  Patient progressed to complete and delivered below without issues. FHT remained reassuring throughout process.   Delivery Date/Time: Thursday February 23, 2023 at 1043 Delivery: Called to room and patient was complete and pushing. Head delivered in LOA with restitution to LOT . Maternal pushing efforts minimal and posterior shoulder and right hand delivered without incident f/b anterior shoulder and body usual fashion. No nuchal cord present.  Infant with spontaneous cry and was dried and stimulated by provider before being placed on mother's chest.  Nurses continued to dry and stimulate infant with good results. Cord clamped x 2 after 6-minute delay, and cut by FOB. Cord blood drawn. Placenta delivered spontaneously, via [place], with gentle cord traction and noted have an extra lobe, but otherwise intact. Pitocin started.  Vaginal inspection revealed no lacerations.  IUD inserted bimanually.  Fundus firm, at the umbilicus, and bleeding small.  Mother hemodynamically stable and infant skin to skin prior to provider exit.  Mother opts to breastfeed.    Placenta: Intact, Disposal Complications: None Lacerations: None EBL: 208 mL Analgesia: Epidural  Postpartum Planning -Discharge Summary Shared -PP Message sent for appt -IUD in Place  Infant: Female-Lindsay Whitney  APGARs 9, 9  3320g 7lbs 5oz, 19.25in  Cherre Robins, CNM  02/23/2023 11:27 AM

## 2022-12-20 ENCOUNTER — Telehealth: Payer: Self-pay

## 2022-12-20 NOTE — Telephone Encounter (Signed)
The patient was contacted to reschedule her missed OB appointment.  The patient's phone went directly to voicemail and a voicemail was left asking the patient to contact the office for an appointment.

## 2022-12-30 ENCOUNTER — Encounter: Payer: Self-pay | Admitting: Obstetrics

## 2022-12-30 ENCOUNTER — Other Ambulatory Visit: Payer: Medicaid Other

## 2022-12-30 ENCOUNTER — Ambulatory Visit (INDEPENDENT_AMBULATORY_CARE_PROVIDER_SITE_OTHER): Payer: Medicaid Other | Admitting: Obstetrics

## 2022-12-30 VITALS — BP 117/65 | HR 101 | Wt 152.4 lb

## 2022-12-30 DIAGNOSIS — Z3483 Encounter for supervision of other normal pregnancy, third trimester: Secondary | ICD-10-CM | POA: Diagnosis not present

## 2022-12-30 DIAGNOSIS — Z3A31 31 weeks gestation of pregnancy: Secondary | ICD-10-CM

## 2022-12-30 DIAGNOSIS — Z3480 Encounter for supervision of other normal pregnancy, unspecified trimester: Secondary | ICD-10-CM

## 2022-12-30 NOTE — Addendum Note (Signed)
Addended by: Marcell Anger D on: 12/30/2022 09:35 AM   Modules accepted: Orders

## 2022-12-30 NOTE — Progress Notes (Signed)
Patient presents for 2 gtt labs. Tdap offered; patient declined.
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
## 2022-12-31 LAB — CBC
Hematocrit: 30.1 % — ABNORMAL LOW (ref 34.0–46.6)
Hemoglobin: 9.9 g/dL — ABNORMAL LOW (ref 11.1–15.9)
MCH: 29.6 pg (ref 26.6–33.0)
MCHC: 32.9 g/dL (ref 31.5–35.7)
MCV: 90 fL (ref 79–97)
Platelets: 143 10*3/uL — ABNORMAL LOW (ref 150–450)
RBC: 3.34 x10E6/uL — ABNORMAL LOW (ref 3.77–5.28)
RDW: 12.2 % (ref 11.7–15.4)
WBC: 6.1 10*3/uL (ref 3.4–10.8)

## 2022-12-31 LAB — GLUCOSE TOLERANCE, 2 HOURS W/ 1HR
Glucose, 1 hour: 143 mg/dL (ref 70–179)
Glucose, 2 hour: 111 mg/dL (ref 70–152)
Glucose, Fasting: 83 mg/dL (ref 70–91)

## 2022-12-31 LAB — RPR: RPR Ser Ql: NONREACTIVE

## 2022-12-31 LAB — HIV ANTIBODY (ROUTINE TESTING W REFLEX): HIV Screen 4th Generation wRfx: NONREACTIVE

## 2023-01-03 ENCOUNTER — Other Ambulatory Visit: Payer: Self-pay | Admitting: Obstetrics

## 2023-01-03 DIAGNOSIS — O99013 Anemia complicating pregnancy, third trimester: Secondary | ICD-10-CM

## 2023-01-03 MED ORDER — ACCRUFER 30 MG PO CAPS: 1.0000 | ORAL_CAPSULE | Freq: Two times a day (BID) | ORAL | 3 refills | Status: DC

## 2023-01-06 ENCOUNTER — Inpatient Hospital Stay (HOSPITAL_COMMUNITY)
Admission: AD | Admit: 2023-01-06 | Discharge: 2023-01-06 | Disposition: A | Discharge status: Home / Self Care | Payer: Medicaid Other | Attending: Obstetrics & Gynecology | Admitting: Obstetrics & Gynecology

## 2023-01-06 ENCOUNTER — Telehealth: Payer: Self-pay

## 2023-01-06 ENCOUNTER — Encounter (HOSPITAL_COMMUNITY): Payer: Self-pay | Admitting: Obstetrics & Gynecology

## 2023-01-06 ENCOUNTER — Other Ambulatory Visit: Payer: Self-pay

## 2023-01-06 DIAGNOSIS — B3731 Acute candidiasis of vulva and vagina: Secondary | ICD-10-CM | POA: Diagnosis not present

## 2023-01-06 DIAGNOSIS — N760 Acute vaginitis: Secondary | ICD-10-CM | POA: Diagnosis not present

## 2023-01-06 DIAGNOSIS — O26893 Other specified pregnancy related conditions, third trimester: Secondary | ICD-10-CM | POA: Insufficient documentation

## 2023-01-06 DIAGNOSIS — Z3A32 32 weeks gestation of pregnancy: Secondary | ICD-10-CM | POA: Diagnosis not present

## 2023-01-06 DIAGNOSIS — B9689 Other specified bacterial agents as the cause of diseases classified elsewhere: Secondary | ICD-10-CM | POA: Insufficient documentation

## 2023-01-06 DIAGNOSIS — O23593 Infection of other part of genital tract in pregnancy, third trimester: Secondary | ICD-10-CM | POA: Insufficient documentation

## 2023-01-06 DIAGNOSIS — O98813 Other maternal infectious and parasitic diseases complicating pregnancy, third trimester: Secondary | ICD-10-CM | POA: Diagnosis not present

## 2023-01-06 LAB — URINALYSIS, ROUTINE W REFLEX MICROSCOPIC
Bilirubin Urine: NEGATIVE
Glucose, UA: NEGATIVE mg/dL
Hgb urine dipstick: NEGATIVE
Ketones, ur: NEGATIVE mg/dL
Nitrite: NEGATIVE
Protein, ur: NEGATIVE mg/dL
Specific Gravity, Urine: 1.011 (ref 1.005–1.030)
pH: 7 (ref 5.0–8.0)

## 2023-01-06 LAB — WET PREP, GENITAL
Sperm: NONE SEEN
Trich, Wet Prep: NONE SEEN
WBC, Wet Prep HPF POC: >=10 — AB (ref <10)

## 2023-01-06 MED ORDER — METRONIDAZOLE 0.75 % VA GEL: 1.0000 | Freq: Every day | VAGINAL | 1 refills | Status: DC

## 2023-01-06 MED ORDER — TERCONAZOLE 0.8 % VA CREA: 1.0000 | TOPICAL_CREAM | Freq: Every day | VAGINAL | 0 refills | Status: DC

## 2023-01-06 NOTE — MAU Provider Note (Signed)
History     CSN: [id number]  Arrival date and time: 01/06/23 1746   None     Chief Complaint  Patient presents with   Abdominal Pain   HPI Lindsay Whitney is a 27 y.o. [id number] at 62w6dwho presents to MAU for lower abdominal pain. She reports lower abdominal tightness started around 1530 and worsened around 1600. Pain initially constant but now intermittent and feels more like contractions and as if baby is "pushing down into vagina". Since arrival and lying down, she reports pain feels as if it is easing up. She denies leaking fluid or vaginal bleeding. She reports increased frequency with urination but denies dysuria or hematuria. She is also having some vaginal itching and was sent some medicine today but has not picked it up. Endorses active fetal movement.  Pregnancy course: late/limited care.    OB History     Gravida  5   Para  3   Term  3   Preterm      AB  1   Living  3      SAB  1   IAB      Ectopic      Multiple  0   Live Births  3           Past Medical History:  Diagnosis Date   Benign gestational thrombocytopenia ([place] 07/10/2014   Kidney stones 2013   Pyelonephritis 2013   UTI (lower urinary tract infection)     Past Surgical History:  Procedure Laterality Date   NO PAST SURGERIES      Family History  Problem Relation Age of Onset   Diabetes Maternal Grandmother    Diabetes Maternal Grandfather     Social History   Tobacco Use   Smoking status: Never   Smokeless tobacco: Never  Vaping Use   Vaping Use: Never used  Substance Use Topics   Alcohol use: Not Currently    Comment: stopped after confimred pregnancy   Drug use: No    Allergies: No Known Allergies  No medications prior to admission.   Review of Systems  Gastrointestinal:  Positive for abdominal pain.  Genitourinary:  Positive for pelvic pain.       Vaginal itching   All other systems reviewed and are negative.  Physical Exam   Blood pressure 114/72, pulse  91, temperature 97.6 F (36.4 C), resp. rate 18, last menstrual period 04/21/2022, SpO2 100 %, unknown if currently breastfeeding.  Physical Exam Vitals and nursing note reviewed. Exam conducted with a chaperone present.  Constitutional:      General: She is not in acute distress. Eyes:     Extraocular Movements: Extraocular movements intact.     Pupils: Pupils are equal, round, and reactive to light.  Cardiovascular:     Rate and Rhythm: Normal rate.  Pulmonary:     Effort: Pulmonary effort is normal. No respiratory distress.  Abdominal:     Palpations: Abdomen is soft.     Tenderness: There is no abdominal tenderness.     Comments: Gravid    Genitourinary:    Comments: Normal external female genitalia, vaginal walls pink and well-rugated, moderate amount of thick white vaginal discharge, no bleeding, cervix visually closed without lesions/masses Skin:    General: Skin is warm and dry.  Neurological:     General: No focal deficit present.     Mental Status: She is alert and oriented to person, place, and time.  Psychiatric:  Mood and Affect: Mood normal.        Behavior: Behavior normal.   Dilation: Closed Effacement (%): Thick Exam by:: Maryagnes Amos, CNM  NST FHR: 135 bpm, moderate variability, +15x15 accels, no decels Toco: quiet  Results for orders placed or performed during the hospital encounter of 01/06/23 (from the past 24 hour(s))  Wet prep, genital     Status: Abnormal   Collection Time: 01/06/23  6:45 PM  Result Value Ref Range   Yeast Wet Prep HPF POC PRESENT (A) NONE SEEN   Trich, Wet Prep NONE SEEN NONE SEEN   Clue Cells Wet Prep HPF POC PRESENT (A) NONE SEEN   WBC, Wet Prep HPF POC >=10 (A) <10   Sperm NONE SEEN   Urinalysis, Routine w reflex microscopic -Urine, Clean Catch     Status: Abnormal   Collection Time: 01/06/23  7:24 PM  Result Value Ref Range   Color, Urine YELLOW YELLOW   APPearance CLEAR CLEAR   Specific Gravity, Urine 1.011  1.005 - 1.030   pH 7.0 5.0 - 8.0   Glucose, UA NEGATIVE NEGATIVE mg/dL   Hgb urine dipstick NEGATIVE NEGATIVE   Bilirubin Urine NEGATIVE NEGATIVE   Ketones, ur NEGATIVE NEGATIVE mg/dL   Protein, ur NEGATIVE NEGATIVE mg/dL   Nitrite NEGATIVE NEGATIVE   Leukocytes,Ua SMALL (A) NEGATIVE   RBC / HPF 0-5 0 - 5 RBC/hpf   WBC, UA 0-5 0 - 5 WBC/hpf   Bacteria, UA RARE (A) NONE SEEN   Squamous Epithelial / HPF 0-5 0 - 5 /HPF   Mucus PRESENT     MAU Course  Procedures  MDM UA Wet prep PO hydration NST  PO hydration. NST reactive. Toco quiet. UA unremarkable. Will treat patient for BV. Patient has rx for terazol at home. Instructed to complete metrogel first and then start terazol. Declines recheck as she reports pain has resolved. Low suspicion for preterm labor.   Assessment and Plan   1. [redacted] weeks gestation of pregnancy   2. Bacterial vaginosis   3. Vaginal yeast infection    - Discharge home in stable condition - Rx sent to pharmacy - Strict return precautions. Return to MAU as needed for new/worsening symptoms.  - Keep OB appointment as scheduled   Renee Harder, CNM 01/06/2023, 8:09 PM

## 2023-01-06 NOTE — MAU Note (Signed)
.  Lindsay Whitney is a 27 y.o. at 78w6dhere in MAU reporting: Constant abdominal pain for 2 hours.    Onset of complaint: 2 hours Pain score: 8/10 lower abdomen to pelvis  There were no vitals filed for this visit.    Lab orders placed from triage:   ua

## 2023-01-06 NOTE — Telephone Encounter (Signed)
Returned call, pt reports vaginal itching and white discharge. Sent rx for yeast per protocol

## 2023-01-09 LAB — GC/CHLAMYDIA PROBE AMP (~~LOC~~) NOT AT ARMC
Chlamydia: NEGATIVE
Comment: NEGATIVE
Comment: NORMAL
Neisseria Gonorrhea: NEGATIVE

## 2023-01-13 ENCOUNTER — Ambulatory Visit (INDEPENDENT_AMBULATORY_CARE_PROVIDER_SITE_OTHER): Payer: Medicaid Other | Admitting: Obstetrics

## 2023-01-13 ENCOUNTER — Encounter: Payer: Self-pay | Admitting: Obstetrics

## 2023-01-13 VITALS — BP 104/58 | HR 90 | Wt 153.0 lb

## 2023-01-13 DIAGNOSIS — Z3A33 33 weeks gestation of pregnancy: Secondary | ICD-10-CM

## 2023-01-13 DIAGNOSIS — Z3483 Encounter for supervision of other normal pregnancy, third trimester: Secondary | ICD-10-CM

## 2023-01-13 DIAGNOSIS — Z3480 Encounter for supervision of other normal pregnancy, unspecified trimester: Secondary | ICD-10-CM
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
## 2023-01-17 ENCOUNTER — Encounter: Payer: Self-pay | Admitting: Obstetrics

## 2023-01-25 ENCOUNTER — Other Ambulatory Visit: Payer: Self-pay

## 2023-01-25 NOTE — Progress Notes (Signed)
Breast pump Rx order faxed back to [facility].

## 2023-01-27 ENCOUNTER — Ambulatory Visit (INDEPENDENT_AMBULATORY_CARE_PROVIDER_SITE_OTHER): Payer: Medicaid Other | Admitting: Obstetrics

## 2023-01-27 ENCOUNTER — Encounter: Payer: Self-pay | Admitting: Obstetrics

## 2023-01-27 VITALS — BP 108/71 | HR 113 | Wt 156.0 lb

## 2023-01-27 DIAGNOSIS — Z3A35 35 weeks gestation of pregnancy: Secondary | ICD-10-CM

## 2023-01-27 DIAGNOSIS — Z3483 Encounter for supervision of other normal pregnancy, third trimester: Secondary | ICD-10-CM | POA: Diagnosis not present

## 2023-01-27 DIAGNOSIS — Z3480 Encounter for supervision of other normal pregnancy, unspecified trimester: Secondary | ICD-10-CM

## 2023-01-27 NOTE — Progress Notes (Signed)
ROB/GBS.  Reports no concerns today.
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
## 2023-01-30 LAB — CULTURE, BETA STREP (GROUP B ONLY): Strep Gp B Culture: POSITIVE — AB

## 2023-02-02 ENCOUNTER — Encounter: Payer: Self-pay | Admitting: Obstetrics

## 2023-02-02 ENCOUNTER — Ambulatory Visit (INDEPENDENT_AMBULATORY_CARE_PROVIDER_SITE_OTHER): Payer: Medicaid Other | Admitting: Obstetrics

## 2023-02-02 VITALS — BP 117/72 | HR 124 | Wt 156.7 lb

## 2023-02-02 DIAGNOSIS — Z3A36 36 weeks gestation of pregnancy: Secondary | ICD-10-CM

## 2023-02-02 DIAGNOSIS — Z3483 Encounter for supervision of other normal pregnancy, third trimester: Secondary | ICD-10-CM

## 2023-02-02 DIAGNOSIS — Z3480 Encounter for supervision of other normal pregnancy, unspecified trimester: Secondary | ICD-10-CM

## 2023-02-02 NOTE — Progress Notes (Signed)
Pt presents for ROB. Reports some SOB, denies CP
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
## 2023-02-06 ENCOUNTER — Inpatient Hospital Stay (HOSPITAL_COMMUNITY)
Admission: AD | Admit: 2023-02-06 | Discharge: 2023-02-06 | Disposition: A | Discharge status: Home / Self Care | Payer: Medicaid Other | Attending: Obstetrics & Gynecology | Admitting: Obstetrics & Gynecology

## 2023-02-06 ENCOUNTER — Encounter (HOSPITAL_COMMUNITY): Payer: Self-pay | Admitting: Obstetrics & Gynecology

## 2023-02-06 DIAGNOSIS — Z3403 Encounter for supervision of normal first pregnancy, third trimester: Secondary | ICD-10-CM | POA: Insufficient documentation

## 2023-02-06 DIAGNOSIS — Z3480 Encounter for supervision of other normal pregnancy, unspecified trimester: Secondary | ICD-10-CM

## 2023-02-06 DIAGNOSIS — Z3A37 37 weeks gestation of pregnancy: Secondary | ICD-10-CM

## 2023-02-06 DIAGNOSIS — Z3A38 38 weeks gestation of pregnancy: Secondary | ICD-10-CM | POA: Diagnosis present

## 2023-02-06 DIAGNOSIS — O479 False labor, unspecified: Secondary | ICD-10-CM

## 2023-02-06 NOTE — MAU Note (Signed)
Pt says strong UC's since 230pm PNC- with [facility] - No VE Denies HSV GBS- positive

## 2023-02-07 ENCOUNTER — Inpatient Hospital Stay (HOSPITAL_COMMUNITY)
Admission: AD | Admit: 2023-02-07 | Discharge: 2023-02-08 | Disposition: A | Discharge status: Home / Self Care | Payer: Medicaid Other | Attending: Obstetrics and Gynecology | Admitting: Obstetrics and Gynecology

## 2023-02-07 DIAGNOSIS — O479 False labor, unspecified: Secondary | ICD-10-CM

## 2023-02-07 DIAGNOSIS — Z3A37 37 weeks gestation of pregnancy: Secondary | ICD-10-CM | POA: Diagnosis not present

## 2023-02-07 DIAGNOSIS — O368130 Decreased fetal movements, third trimester, not applicable or unspecified: Secondary | ICD-10-CM

## 2023-02-07 DIAGNOSIS — O212 Late vomiting of pregnancy: Secondary | ICD-10-CM | POA: Insufficient documentation

## 2023-02-07 DIAGNOSIS — E860 Dehydration: Secondary | ICD-10-CM | POA: Diagnosis not present

## 2023-02-07 DIAGNOSIS — R112 Nausea with vomiting, unspecified: Secondary | ICD-10-CM

## 2023-02-07 DIAGNOSIS — Z3480 Encounter for supervision of other normal pregnancy, unspecified trimester: Secondary | ICD-10-CM

## 2023-02-07 DIAGNOSIS — O99283 Endocrine, nutritional and metabolic diseases complicating pregnancy, third trimester: Secondary | ICD-10-CM | POA: Insufficient documentation

## 2023-02-07 DIAGNOSIS — O471 False labor at or after 37 completed weeks of gestation: Secondary | ICD-10-CM | POA: Diagnosis not present

## 2023-02-07 LAB — URINALYSIS, ROUTINE W REFLEX MICROSCOPIC
Bilirubin Urine: NEGATIVE
Glucose, UA: NEGATIVE mg/dL
Hgb urine dipstick: NEGATIVE
Ketones, ur: NEGATIVE mg/dL
Nitrite: NEGATIVE
Protein, ur: 30 mg/dL — AB
Specific Gravity, Urine: 1.026 (ref 1.005–1.030)
pH: 6 (ref 5.0–8.0)

## 2023-02-07 MED ORDER — FENTANYL CITRATE (PF) 100 MCG/2ML IJ SOLN
100.0000 ug | Freq: Once | INTRAMUSCULAR | Status: AC
  Administered 2023-02-07: 100 ug via INTRAMUSCULAR
  Filled 2023-02-07: qty 2

## 2023-02-07 MED ORDER — ONDANSETRON 4 MG PO TBDP
8.0000 mg | ORAL_TABLET | Freq: Once | ORAL | Status: AC
  Administered 2023-02-07: 8 mg via ORAL
  Filled 2023-02-07: qty 2

## 2023-02-07 NOTE — MAU Note (Addendum)
.  Lindsay Whitney is a 27 y.o. at [redacted]w[redacted]d here in MAU reporting n/v for 3hrs and DFM for 3hrs. No diarrhea. Having some ctxs. Denies LOF or VB. Was here last night for ctxs and cervix closed.   Onset of complaint: 3hrs ago Pain score: 8 Vitals:   02/07/23 2027 02/07/23 2030  BP:  121/68  Pulse: (!) 103 99  Resp: 17   Temp: 97.7 F (36.5 C)   SpO2: 99%      FHT:135 Lab orders placed from triage:  u/a

## 2023-02-07 NOTE — MAU Provider Note (Signed)
Chief Complaint:  Decreased Fetal Movement and Emesis During Pregnancy   Event Date/Time   First Provider Initiated Contact with Patient 02/07/23 2108     HPI: Lindsay Whitney is a 27 y.o. [id number] at 50w3dwho presents to maternity admissions reporting nausea and vomiting tonight.  These are associated with frequent contractions and decreased Fetal Movement.  No leaking or bleeding. . She reports good fetal movement, denies LOF, vaginal bleeding, vaginal itching/burning, urinary symptoms, h/a, dizziness, n/v, diarrhea, constipation or fever/chills.  She denies headache, visual changes or RUQ abdominal pain.  Emesis  This is a new problem. The current episode started today. There has been no fever. Associated symptoms include abdominal pain. Pertinent negatives include no chills, diarrhea, dizziness, fever, headaches or myalgias. She has tried nothing for the symptoms.  Abdominal Pain The current episode started today. The onset quality is gradual. The problem occurs intermittently. The quality of the pain is cramping. The abdominal pain does not radiate. Associated symptoms include vomiting. Pertinent negatives include no diarrhea, fever, headaches or myalgias. Nothing aggravates the pain. The pain is relieved by Nothing. She has tried nothing for the symptoms.   RN Note: Lindsay Whitney is a 27 y.o. at [redacted]w[redacted]d here in MAU reporting n/v for 3hrs and DFM for 3hrs. No diarrhea. Having some ctxs. Denies LOF or VB. Was here last night for ctxs and cervix closed.   Onset of complaint: 3hrs ago                                Pain score: 8  Past Medical History: Past Medical History:  Diagnosis Date   Benign gestational thrombocytopenia 07/10/2014   Kidney stones 2013   Pyelonephritis 2013   UTI (lower urinary tract infection)     Past obstetric history: OB History  Gravida Para Term Preterm AB Living  5 3 3   1 3   SAB IAB Ectopic Multiple Live Births  1     0 3    # Outcome Date GA Lbr Len/2nd  Weight Sex Delivery Anes PTL Lv  5 Current           4 Term 01/02/21 [redacted]w[redacted]d 10:02 / 01:16 3396 g M Vag-Spont EPI  LIV     Birth Comments: WNL  3 SAB 03/2019          2 Term 07/10/14 [redacted]w[redacted]d 19:11 / 00:44 3550 g M Vag-Spont EPI  LIV  1 Term 04/20/13 [redacted]w[redacted]d 38:40 / 00:31 2925 g F Vag-Spont Local, EPI  LIV    Past Surgical History: Past Surgical History:  Procedure Laterality Date   NO PAST SURGERIES      Family History: Family History  Problem Relation Age of Onset   Diabetes Maternal Grandmother    Diabetes Maternal Grandfather     Social History: Social History   Tobacco Use   Smoking status: Never   Smokeless tobacco: Never  Vaping Use   Vaping Use: Never used  Substance Use Topics   Alcohol use: Not Currently    Comment: stopped after confimred pregnancy   Drug use: No    Allergies: No Known Allergies  Meds:  Medications Prior to Admission  Medication Sig Dispense Refill Last Dose   acetaminophen (TYLENOL) 500 MG tablet Take 2 tablets (1,000 mg total) by mouth every 8 (eight) hours as needed. (Patient not taking: Reported on 12/30/2022)  0    DICLEGIS 10-10 MG TBEC Take 2 tablets by  mouth at bedtime. If symptoms persist, add one tablet in the morning and one in the afternoon (Patient not taking: Reported on 09/05/2022) 100 tablet 5    Ferric Maltol (ACCRUFER) 30 MG CAPS Take 1 capsule (30 mg total) by mouth 2 (two) times daily before a meal. Take 2 hrs before, or 2 hrs after a meal. 60 capsule 3    metroNIDAZOLE (METROGEL) 0.75 % vaginal gel Place 1 Applicatorful vaginally at bedtime. Apply one applicatorful to vagina at bedtime for 5 days (Patient not taking: Reported on 02/02/2023) 70 g 1    Prenatal Vit-Min-FA-Fish Oil ([employer] PRENATAL GUMMY PO) Take 2 tablets by mouth daily.      terconazole (TERAZOL 3) 0.8 % vaginal cream Place 1 applicator vaginally at bedtime. (Patient not taking: Reported on 02/02/2023) 20 g 0     I have reviewed patient's Past Medical Hx, Surgical  Hx, Family Hx, Social Hx, medications and allergies.   ROS:  Review of Systems  Constitutional:  Negative for chills and fever.  Gastrointestinal:  Positive for abdominal pain and vomiting. Negative for diarrhea.  Musculoskeletal:  Negative for myalgias.  Neurological:  Negative for dizziness and headaches.   Other systems negative  Physical Exam  Patient Vitals for the past 24 hrs:  BP Temp Pulse Resp SpO2 Height Weight  02/07/23 2030 121/68 -- 99 -- -- -- --  02/07/23 2027 -- 97.7 F (36.5 C) (!) 103 17 99 % 5\' 1"  (1.549 m) 71.7 kg   Constitutional: Well-developed, well-nourished female in no acute distress.  Cardiovascular: normal rate  Respiratory: normal effort GI: Abd soft, non-tender, gravid appropriate for gestational age.   No rebound or guarding. MS: Extremities nontender, no edema, normal ROM Neurologic: Alert and oriented x 4.  GU: Neg CVAT.  PELVIC EXAM: Dilation: 2 Effacement (%): 60 Station: -1 Presentation: Vertex Exam by:: Hansel Feinstein, CNM    FHT:  Baseline *** , moderate variability, accelerations present, no decelerations Contractions: q *** mins Irregular  Rare   Labs: No results found for this or any previous visit (from the past 24 hour(s)). O/Positive/-- (10/18 1114)  Imaging:  No results found.  MAU Course/MDM: I have reviewed the triage vital signs and the nursing notes.   Pertinent labs & imaging results that were available during my care of the patient were reviewed by me and considered in my medical decision making (see chart for details).      I have reviewed her medical records including past results, notes and treatments.   NST reviewed, reactive with accelerations, Patient states still does not feel movement.   Consult *** with presentation, exam findings and test results.  Treatments in MAU included ***.    Assessment: Single IUP at [redacted]w[redacted]d Nausea and vomiting Uterine contractions   Plan: Discharge home Labor precautions  and fetal kick counts Follow up in Office for prenatal visits and recheck   Pt stable at time of discharge.  Hansel Feinstein CNM, MSN Certified Nurse-Midwife 02/07/2023 9:09 PM

## 2023-02-08 ENCOUNTER — Telehealth: Payer: Self-pay

## 2023-02-08 DIAGNOSIS — O479 False labor, unspecified: Secondary | ICD-10-CM

## 2023-02-08 DIAGNOSIS — R112 Nausea with vomiting, unspecified: Secondary | ICD-10-CM

## 2023-02-08 DIAGNOSIS — Z3A37 37 weeks gestation of pregnancy: Secondary | ICD-10-CM | POA: Diagnosis not present

## 2023-02-08 DIAGNOSIS — E860 Dehydration: Secondary | ICD-10-CM

## 2023-02-08 DIAGNOSIS — O368130 Decreased fetal movements, third trimester, not applicable or unspecified: Secondary | ICD-10-CM | POA: Diagnosis not present

## 2023-02-08 DIAGNOSIS — O212 Late vomiting of pregnancy: Secondary | ICD-10-CM | POA: Diagnosis not present

## 2023-02-08 LAB — COMPREHENSIVE METABOLIC PANEL
ALT: 13 U/L (ref 0–44)
AST: 17 U/L (ref 15–41)
Albumin: 2.7 g/dL — ABNORMAL LOW (ref 3.5–5.0)
Alkaline Phosphatase: 125 U/L (ref 38–126)
Anion gap: 9 (ref 5–15)
BUN: 9 mg/dL (ref 6–20)
CO2: 21 mmol/L — ABNORMAL LOW (ref 22–32)
Calcium: 8.4 mg/dL — ABNORMAL LOW (ref 8.9–10.3)
Chloride: 106 mmol/L (ref 98–111)
Creatinine, Ser: 0.52 mg/dL (ref 0.44–1.00)
GFR, Estimated: >60 mL/min (ref >60)
Glucose, Bld: 103 mg/dL — ABNORMAL HIGH (ref 70–99)
Potassium: 3.3 mmol/L — ABNORMAL LOW (ref 3.5–5.1)
Sodium: 136 mmol/L (ref 135–145)
Total Bilirubin: 0.8 mg/dL (ref 0.3–1.2)
Total Protein: 6.3 g/dL — ABNORMAL LOW (ref 6.5–8.1)

## 2023-02-08 LAB — CBC WITH DIFFERENTIAL/PLATELET
Abs Immature Granulocytes: 0.07 10*3/uL (ref 0.00–0.07)
Basophils Absolute: 0 10*3/uL (ref 0.0–0.1)
Basophils Relative: 0 %
Eosinophils Absolute: 0 10*3/uL (ref 0.0–0.5)
Eosinophils Relative: 0 %
HCT: 32.9 % — ABNORMAL LOW (ref 36.0–46.0)
Hemoglobin: 10.8 g/dL — ABNORMAL LOW (ref 12.0–15.0)
Immature Granulocytes: 1 %
Lymphocytes Relative: 8 %
Lymphs Abs: 0.8 10*3/uL (ref 0.7–4.0)
MCH: 28.8 pg (ref 26.0–34.0)
MCHC: 32.8 g/dL (ref 30.0–36.0)
MCV: 87.7 fL (ref 80.0–100.0)
Monocytes Absolute: 0.5 10*3/uL (ref 0.1–1.0)
Monocytes Relative: 5 %
Neutro Abs: 8.7 10*3/uL — ABNORMAL HIGH (ref 1.7–7.7)
Neutrophils Relative %: 86 %
Platelets: 156 10*3/uL (ref 150–400)
RBC: 3.75 MIL/uL — ABNORMAL LOW (ref 3.87–5.11)
RDW: 13 % (ref 11.5–15.5)
WBC: 10.1 10*3/uL (ref 4.0–10.5)
nRBC: 0 % (ref 0.0–0.2)

## 2023-02-08 MED ORDER — LACTATED RINGERS IV SOLN: Freq: Once | INTRAVENOUS | Status: AC

## 2023-02-08 MED ORDER — SODIUM CHLORIDE 0.9 % IV SOLN
12.5000 mg | Freq: Once | INTRAVENOUS | Status: AC
  Administered 2023-02-08: 12.5 mg via INTRAVENOUS
  Filled 2023-02-08: qty 12.5

## 2023-02-08 MED ORDER — ONDANSETRON 4 MG PO TBDP: 4.0000 mg | ORAL_TABLET | Freq: Four times a day (QID) | ORAL | 0 refills | Status: DC | PRN

## 2023-02-08 MED ORDER — PROMETHAZINE HCL 25 MG PO TABS: 25.0000 mg | ORAL_TABLET | Freq: Four times a day (QID) | ORAL | 2 refills | Status: DC | PRN

## 2023-02-08 NOTE — Telephone Encounter (Signed)
Patient called stating that she is continuing to have contractions and that they have not changed since her discharge from MAU last night. She said she is resting today. Patient advised to continue to monitor and if contractions become more intense and closer together, have LOF, bleeding, or decreased fetal movement to go back to MAU.

## 2023-02-09 ENCOUNTER — Encounter: Payer: Self-pay | Admitting: Obstetrics and Gynecology

## 2023-02-09 ENCOUNTER — Ambulatory Visit (INDEPENDENT_AMBULATORY_CARE_PROVIDER_SITE_OTHER): Payer: Medicaid Other | Admitting: Obstetrics and Gynecology

## 2023-02-09 VITALS — BP 103/64 | HR 93 | Wt 157.0 lb

## 2023-02-09 DIAGNOSIS — O99820 Streptococcus B carrier state complicating pregnancy: Secondary | ICD-10-CM

## 2023-02-09 DIAGNOSIS — Z3A37 37 weeks gestation of pregnancy: Secondary | ICD-10-CM

## 2023-02-09 DIAGNOSIS — Z3480 Encounter for supervision of other normal pregnancy, unspecified trimester: Secondary | ICD-10-CM

## 2023-02-09 NOTE — Patient Instructions (Addendum)
Return to office for any scheduled appointments. Call the office or go to the MAU at [facility] [place] if: You begin to have strong, frequent contractions Your water breaks.  Sometimes it is a big gush of fluid, sometimes it is just a trickle that keeps getting your underwear wet or running down your legs You have vaginal bleeding.  It is normal to have a small amount of spotting if your cervix was checked.  You do not feel your baby moving like normal.  If you do not, get something to eat and drink and lay down and focus on feeling your baby move.   If your baby is still not moving like normal, you should call the office or go to MAU. Any other obstetric concerns.  
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
## 2023-02-17 ENCOUNTER — Ambulatory Visit (INDEPENDENT_AMBULATORY_CARE_PROVIDER_SITE_OTHER): Payer: Medicaid Other | Admitting: Obstetrics

## 2023-02-17 ENCOUNTER — Encounter: Payer: Self-pay | Admitting: Obstetrics

## 2023-02-17 VITALS — BP 120/72 | HR 98 | Wt 159.1 lb

## 2023-02-17 DIAGNOSIS — Z3A38 38 weeks gestation of pregnancy: Secondary | ICD-10-CM | POA: Diagnosis not present

## 2023-02-17 DIAGNOSIS — O99820 Streptococcus B carrier state complicating pregnancy: Secondary | ICD-10-CM

## 2023-02-17 DIAGNOSIS — Z3480 Encounter for supervision of other normal pregnancy, unspecified trimester: Secondary | ICD-10-CM
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
## 2023-02-22 ENCOUNTER — Encounter (HOSPITAL_COMMUNITY): Payer: Self-pay | Admitting: Obstetrics and Gynecology

## 2023-02-22 ENCOUNTER — Inpatient Hospital Stay (HOSPITAL_COMMUNITY)
Admission: AD | Admit: 2023-02-22 | Discharge: 2023-02-24 | DRG: 807 | Disposition: A | Discharge status: Home / Self Care | Payer: Medicaid Other | Attending: Family Medicine | Admitting: Family Medicine

## 2023-02-22 ENCOUNTER — Other Ambulatory Visit: Payer: Self-pay

## 2023-02-22 DIAGNOSIS — Z30430 Encounter for insertion of intrauterine contraceptive device: Secondary | ICD-10-CM

## 2023-02-22 DIAGNOSIS — Z148 Genetic carrier of other disease: Secondary | ICD-10-CM | POA: Diagnosis not present

## 2023-02-22 DIAGNOSIS — Z3490 Encounter for supervision of normal pregnancy, unspecified, unspecified trimester: Secondary | ICD-10-CM

## 2023-02-22 DIAGNOSIS — Z3A39 39 weeks gestation of pregnancy: Secondary | ICD-10-CM

## 2023-02-22 DIAGNOSIS — Z3689 Encounter for other specified antenatal screening: Secondary | ICD-10-CM

## 2023-02-22 DIAGNOSIS — O99820 Streptococcus B carrier state complicating pregnancy: Secondary | ICD-10-CM

## 2023-02-22 DIAGNOSIS — O99214 Obesity complicating childbirth: Secondary | ICD-10-CM | POA: Diagnosis present

## 2023-02-22 DIAGNOSIS — Z3480 Encounter for supervision of other normal pregnancy, unspecified trimester: Principal | ICD-10-CM

## 2023-02-22 DIAGNOSIS — Z30014 Encounter for initial prescription of intrauterine contraceptive device: Secondary | ICD-10-CM | POA: Clinically undetermined

## 2023-02-22 DIAGNOSIS — O99824 Streptococcus B carrier state complicating childbirth: Secondary | ICD-10-CM | POA: Diagnosis present

## 2023-02-22 DIAGNOSIS — O26893 Other specified pregnancy related conditions, third trimester: Secondary | ICD-10-CM | POA: Diagnosis present

## 2023-02-22 DIAGNOSIS — O322XX0 Maternal care for transverse and oblique lie, not applicable or unspecified: Secondary | ICD-10-CM | POA: Diagnosis not present

## 2023-02-22 DIAGNOSIS — O9902 Anemia complicating childbirth: Principal | ICD-10-CM | POA: Diagnosis present

## 2023-02-22 LAB — TYPE AND SCREEN

## 2023-02-22 LAB — CBC
HCT: 31.7 % — ABNORMAL LOW (ref 36.0–46.0)
Hemoglobin: 10.5 g/dL — ABNORMAL LOW (ref 12.0–15.0)
MCH: 28.6 pg (ref 26.0–34.0)
MCHC: 33.1 g/dL (ref 30.0–36.0)
MCV: 86.4 fL (ref 80.0–100.0)
Platelets: 188 10*3/uL (ref 150–400)
RBC: 3.67 MIL/uL — ABNORMAL LOW (ref 3.87–5.11)
RDW: 13.5 % (ref 11.5–15.5)
WBC: 8.9 10*3/uL (ref 4.0–10.5)
nRBC: 0 % (ref 0.0–0.2)

## 2023-02-22 LAB — AMNISURE RUPTURE OF MEMBRANE (ROM) NOT AT ARMC: Amnisure ROM: NEGATIVE

## 2023-02-22 LAB — POCT FERN TEST: POCT Fern Test: NEGATIVE

## 2023-02-22 MED ORDER — ONDANSETRON HCL 4 MG/2ML IJ SOLN: 4.0000 mg | Freq: Four times a day (QID) | INTRAMUSCULAR | Status: DC | PRN

## 2023-02-22 MED ORDER — PHENYLEPHRINE 80 MCG/ML (10ML) SYRINGE FOR IV PUSH (FOR BLOOD PRESSURE SUPPORT): 80.0000 ug | PREFILLED_SYRINGE | INTRAVENOUS | Status: DC | PRN

## 2023-02-22 MED ORDER — FENTANYL-BUPIVACAINE-NACL 0.5-0.125-0.9 MG/250ML-% EP SOLN
12.0000 mL/h | EPIDURAL | Status: DC | PRN
  Administered 2023-02-23: 12 mL/h via EPIDURAL
  Filled 2023-02-22: qty 250

## 2023-02-22 MED ORDER — OXYTOCIN BOLUS FROM INFUSION
333.0000 mL | Freq: Once | INTRAVENOUS | Status: AC
  Administered 2023-02-23: 333 mL via INTRAVENOUS

## 2023-02-22 MED ORDER — ACETAMINOPHEN-CAFFEINE 500-65 MG PO TABS
2.0000 | ORAL_TABLET | Freq: Once | ORAL | Status: AC
  Administered 2023-02-22: 2 via ORAL
  Filled 2023-02-22: qty 2

## 2023-02-22 MED ORDER — PENICILLIN G POT IN DEXTROSE 60000 UNIT/ML IV SOLN
3.0000 10*6.[IU] | INTRAVENOUS | Status: DC
  Administered 2023-02-23 (×2): 3 10*6.[IU] via INTRAVENOUS
  Filled 2023-02-22 (×2): qty 50

## 2023-02-22 MED ORDER — EPHEDRINE 5 MG/ML INJ: 10.0000 mg | INTRAVENOUS | Status: DC | PRN

## 2023-02-22 MED ORDER — OXYTOCIN-SODIUM CHLORIDE 30-0.9 UT/500ML-% IV SOLN
2.5000 [IU]/h | INTRAVENOUS | Status: DC
  Filled 2023-02-22: qty 500

## 2023-02-22 MED ORDER — LACTATED RINGERS IV SOLN: 500.0000 mL | INTRAVENOUS | Status: DC | PRN

## 2023-02-22 MED ORDER — SODIUM CHLORIDE 0.9 % IV SOLN
5.0000 10*6.[IU] | Freq: Once | INTRAVENOUS | Status: AC
  Administered 2023-02-22: 5 10*6.[IU] via INTRAVENOUS
  Filled 2023-02-22: qty 5

## 2023-02-22 MED ORDER — ACETAMINOPHEN 325 MG PO TABS: 650.0000 mg | ORAL_TABLET | ORAL | Status: DC | PRN

## 2023-02-22 MED ORDER — OXYCODONE-ACETAMINOPHEN 5-325 MG PO TABS: 2.0000 | ORAL_TABLET | ORAL | Status: DC | PRN

## 2023-02-22 MED ORDER — SOD CITRATE-CITRIC ACID 500-334 MG/5ML PO SOLN: 30.0000 mL | ORAL | Status: DC | PRN

## 2023-02-22 MED ORDER — LACTATED RINGERS IV SOLN: INTRAVENOUS | Status: DC

## 2023-02-22 MED ORDER — OXYCODONE-ACETAMINOPHEN 5-325 MG PO TABS: 1.0000 | ORAL_TABLET | ORAL | Status: DC | PRN

## 2023-02-22 MED ORDER — LACTATED RINGERS IV SOLN
500.0000 mL | Freq: Once | INTRAVENOUS | Status: AC
  Administered 2023-02-22: 500 mL via INTRAVENOUS

## 2023-02-22 MED ORDER — LIDOCAINE HCL (PF) 1 % IJ SOLN: 30.0000 mL | INTRAMUSCULAR | Status: DC | PRN

## 2023-02-22 MED ORDER — DIPHENHYDRAMINE HCL 50 MG/ML IJ SOLN
12.5000 mg | INTRAMUSCULAR | Status: DC | PRN
  Administered 2023-02-23 (×2): 12.5 mg via INTRAVENOUS
  Filled 2023-02-22: qty 1

## 2023-02-22 MED ORDER — FENTANYL CITRATE (PF) 100 MCG/2ML IJ SOLN: 50.0000 ug | INTRAMUSCULAR | Status: DC | PRN
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
## 2023-02-22 NOTE — MAU Provider Note (Signed)
Chief Complaint:  Labor Eval and Contractions   Event Date/Time   First Provider Initiated Contact with Patient 02/22/23 2144     HPI  HPI: Lindsay Whitney is a 27 y.o. [id number] at 36w4dwho presents to maternity admissions reporting labor, possible leaking of fluid, and mild headache.. She reports good fetal movement, denies vaginal bleeding, vaginal itching/burning, urinary symptoms, dizziness, n/v, diarrhea, constipation or fever/chills.   RN Note: Micalah Cabezas is a 27 y.o. at [redacted]w[redacted]d here in MAU reporting:   Contractions every: 2-4 minutes since 1900 Onset of ctx: Today         Pain score: 9/10  ROM: Possible ROM white discharge that has turned clear fluid with a slow leak around 2000                          Vaginal Bleeding: None Last SVE: 2 cm 2 weeks ago  Fetal Movement: Reports positive FM  Past Medical History: Past Medical History:  Diagnosis Date   Benign gestational thrombocytopenia 07/10/2014   Kidney stones 2013   Pyelonephritis 2013   UTI (lower urinary tract infection)     Past obstetric history: OB History  Gravida Para Term Preterm AB Living  SAB IAB Ectopic Multiple Live Births  1     0 3    # Outcome Date GA Lbr Len/2nd Weight Sex Delivery Anes PTL Lv  5 Current           4 Term 01/02/21 [redacted]w[redacted]d 10:02 / 01:16 3396 g M Vag-Spont EPI  LIV     Birth Comments: WNL  3 SAB 03/2019          2 Term 07/10/14 [redacted]w[redacted]d 19:11 / 00:44 3550 g M Vag-Spont EPI  LIV  1 Term 04/20/13 [redacted]w[redacted]d 38:40 / 00:31 2925 g F Vag-Spont Local, EPI  LIV    Past Surgical History: Past Surgical History:  Procedure Laterality Date   NO PAST SURGERIES      Family History: Family History  Problem Relation Age of Onset   Diabetes Maternal Grandmother    Diabetes Maternal Grandfather     Social History: Social History   Tobacco Use   Smoking status: Never   Smokeless tobacco: Never  Vaping Use   Vaping Use: Never used  Substance Use Topics   Alcohol use: Not Currently     Comment: stopped after confimred pregnancy   Drug use: No    Allergies: No Known Allergies  Meds:  Medications Prior to Admission  Medication Sig Dispense Refill Last Dose   Ferric Maltol (ACCRUFER) 30 MG CAPS Take 1 capsule (30 mg total) by mouth 2 (two) times daily before a meal. Take 2 hrs before, or 2 hrs after a meal. 60 capsule 3 Past Week   Prenatal Vit-Min-FA-Fish Oil ([employer] PRENATAL GUMMY PO) Take 2 tablets by mouth daily.   02/21/2023   ondansetron (ZOFRAN-ODT) 4 MG disintegrating tablet Take 1 tablet (4 mg total) by mouth every 6 (six) hours as needed for nausea. (Patient not taking: Reported on 02/17/2023) 20 tablet 0    promethazine (PHENERGAN) 25 MG tablet Take 1 tablet (25 mg total) by mouth every 6 (six) hours as needed for nausea or vomiting. (Patient not taking: Reported on 02/17/2023) 30 tablet 2     I have reviewed patient's Past Medical Hx, Surgical Hx, Family Hx, Social Hx, medications and allergies.   ROS:  Review of Systems  Constitutional:  Negative for chills and fever.  Respiratory:  Negative for shortness of breath.   Genitourinary:  Positive for pelvic pain and vaginal discharge. Negative for vaginal bleeding.  Neurological:  Positive for headaches.   Other systems negative  Physical Exam  Patient Vitals for the past 24 hrs:  BP Temp Temp src Pulse Resp SpO2 Height Weight  02/22/23 2131 102/64 -- -- (!) 102 -- -- -- --  02/22/23 2110 129/84 98.4 F (36.9 C) Oral (!) 103 17 96 %  (1.549 m) 73 kg   Constitutional: Well-developed, well-nourished female in no acute distress.  Cardiovascular: normal rate  Respiratory: normal effort GI: Abd soft, non-tender, gravid appropriate for gestational age.   No rebound or guarding. MS: Extremities nontender, no edema, normal ROM Neurologic: Alert and oriented x 4.  GU: Neg CVAT.  PELVIC EXAM:  Dilation: 3 Effacement (%): 60 Cervical Position: Middle Station: -2 Presentation: Vertex Exam by:: Jonelle Sidle,  RN  FHT:  Baseline 140 , moderate variability, accelerations present, no decelerations Contractions: q 2 mins Irregular     Labs: Results for orders placed or performed during the hospital encounter of 02/22/23 (from the past 24 hour(s))  POCT fern test     Status: Normal   Collection Time: 02/22/23  9:23 PM  Result Value Ref Range   POCT Fern Test Negative = intact amniotic membranes   Amnisure rupture of membrane (rom)not at [facility]     Status: None   Collection Time: 02/22/23  9:46 PM  Result Value Ref Range   Amnisure ROM NEGATIVE     O/Positive/-- (10/18 1114)  Imaging:  No results found.  MAU Course/MDM: I have reviewed the triage vital signs and the nursing notes.   Pertinent labs & imaging results that were available during my care of the patient were reviewed by me and considered in my medical decision making (see chart for details).      I have reviewed her medical records including past results, notes and treatments.   NST reviewed Amnisure ordered   Recheck of cervix Dilation: 4 Effacement (%): 60 Cervical Position: Posterior Station: -2 Presentation: Vertex Exam by:: E Jackson RN  Treatments in MAU included Excedrin  Tension for headache, amnisure for determination of ROM.    Assessment: Single IUP at [redacted]w[redacted]d Vaginal discharge Mild headache  Plan: Plan per Labor Team Providers  Wynelle Bourgeois CNM, MSN Certified Nurse-Midwife 02/22/2023 9:44 PM

## 2023-02-22 NOTE — MAU Note (Signed)
.  Lindsay Whitney is a 27 y.o. at [redacted]w[redacted]d here in MAU reporting:   Contractions every: 2-4 minutes since 1900 Onset of ctx: Today Pain score: 9/10  ROM: Possible ROM white discharge that has turned clear fluid with a slow leak around 2000 Vaginal Bleeding: None Last SVE: 2 cm 2 weeks ago  Epidural: Planning  Fetal Movement: Reports positive FM FHT:  via Patient taken straight to room  Vitals:   02/23/23 0044 02/23/23 0045  BP:  120/69  Pulse:  96  Resp:    Temp:    SpO2: 100%        OB Office: Faculty GBS: Positive HSV: Denies hx of HSV Lab orders placed from triage: MAU Labor Eval\

## 2023-02-23 ENCOUNTER — Inpatient Hospital Stay (HOSPITAL_COMMUNITY): Payer: Medicaid Other | Admitting: Anesthesiology

## 2023-02-23 ENCOUNTER — Encounter (HOSPITAL_COMMUNITY): Payer: Self-pay | Admitting: Obstetrics and Gynecology

## 2023-02-23 DIAGNOSIS — Z30014 Encounter for initial prescription of intrauterine contraceptive device: Secondary | ICD-10-CM | POA: Clinically undetermined

## 2023-02-23 DIAGNOSIS — Z3A39 39 weeks gestation of pregnancy: Secondary | ICD-10-CM

## 2023-02-23 DIAGNOSIS — O322XX0 Maternal care for transverse and oblique lie, not applicable or unspecified: Secondary | ICD-10-CM

## 2023-02-23 DIAGNOSIS — O99820 Streptococcus B carrier state complicating pregnancy: Secondary | ICD-10-CM

## 2023-02-23 DIAGNOSIS — Z30430 Encounter for insertion of intrauterine contraceptive device: Secondary | ICD-10-CM

## 2023-02-23 LAB — RPR: RPR Ser Ql: NONREACTIVE

## 2023-02-23 LAB — TYPE AND SCREEN

## 2023-02-23 MED ORDER — IBUPROFEN 600 MG PO TABS
600.0000 mg | ORAL_TABLET | Freq: Four times a day (QID) | ORAL | Status: DC
  Administered 2023-02-23 – 2023-02-24 (×3): 600 mg via ORAL
  Filled 2023-02-23 (×4): qty 1

## 2023-02-23 MED ORDER — TERBUTALINE SULFATE 1 MG/ML IJ SOLN: 0.2500 mg | Freq: Once | INTRAMUSCULAR | Status: DC | PRN

## 2023-02-23 MED ORDER — DIPHENHYDRAMINE HCL 25 MG PO CAPS: 25.0000 mg | ORAL_CAPSULE | Freq: Four times a day (QID) | ORAL | Status: DC | PRN

## 2023-02-23 MED ORDER — DIBUCAINE (PERIANAL) 1 % EX OINT: 1.0000 | TOPICAL_OINTMENT | CUTANEOUS | Status: DC | PRN

## 2023-02-23 MED ORDER — LEVONORGESTREL 20 MCG/DAY IU IUD
1.0000 | INTRAUTERINE_SYSTEM | Freq: Once | INTRAUTERINE | Status: AC
  Administered 2023-02-23: 1 via INTRAUTERINE
  Filled 2023-02-23: qty 1

## 2023-02-23 MED ORDER — ONDANSETRON HCL 4 MG/2ML IJ SOLN: 4.0000 mg | INTRAMUSCULAR | Status: DC | PRN

## 2023-02-23 MED ORDER — ZOLPIDEM TARTRATE 5 MG PO TABS: 5.0000 mg | ORAL_TABLET | Freq: Every evening | ORAL | Status: DC | PRN

## 2023-02-23 MED ORDER — PRENATAL MULTIVITAMIN CH
1.0000 | ORAL_TABLET | Freq: Every day | ORAL | Status: DC
  Filled 2023-02-23: qty 1

## 2023-02-23 MED ORDER — OXYTOCIN-SODIUM CHLORIDE 30-0.9 UT/500ML-% IV SOLN
1.0000 m[IU]/min | INTRAVENOUS | Status: DC
  Administered 2023-02-23: 2 m[IU]/min via INTRAVENOUS

## 2023-02-23 MED ORDER — TETANUS-DIPHTH-ACELL PERTUSSIS 5-2.5-18.5 LF-MCG/0.5 IM SUSY: 0.5000 mL | PREFILLED_SYRINGE | Freq: Once | INTRAMUSCULAR | Status: DC

## 2023-02-23 MED ORDER — ONDANSETRON HCL 4 MG PO TABS: 4.0000 mg | ORAL_TABLET | ORAL | Status: DC | PRN

## 2023-02-23 MED ORDER — COCONUT OIL OIL: 1.0000 | TOPICAL_OIL | Status: DC | PRN

## 2023-02-23 MED ORDER — FERROUS SULFATE 325 (65 FE) MG PO TABS
325.0000 mg | ORAL_TABLET | Freq: Two times a day (BID) | ORAL | Status: DC
Start: 2023-02-23 — End: 2023-02-23

## 2023-02-23 MED ORDER — ACETAMINOPHEN 325 MG PO TABS
650.0000 mg | ORAL_TABLET | ORAL | Status: DC | PRN
  Administered 2023-02-23 – 2023-02-24 (×2): 650 mg via ORAL
  Filled 2023-02-23 (×2): qty 2

## 2023-02-23 MED ORDER — WITCH HAZEL-GLYCERIN EX PADS: 1.0000 | MEDICATED_PAD | CUTANEOUS | Status: DC | PRN

## 2023-02-23 MED ORDER — HYDROXYZINE HCL 50 MG/ML IM SOLN: 50.0000 mg | INTRAMUSCULAR | Status: DC | PRN

## 2023-02-23 MED ORDER — SENNOSIDES-DOCUSATE SODIUM 8.6-50 MG PO TABS: 2.0000 | ORAL_TABLET | ORAL | Status: DC

## 2023-02-23 MED ORDER — LIDOCAINE HCL (PF) 1 % IJ SOLN
INTRAMUSCULAR | Status: DC | PRN
  Administered 2023-02-23: 4 mL via EPIDURAL
  Administered 2023-02-23: 5 mL via EPIDURAL

## 2023-02-23 MED ORDER — BENZOCAINE-MENTHOL 20-0.5 % EX AERO
1.0000 | INHALATION_SPRAY | CUTANEOUS | Status: DC | PRN
  Filled 2023-02-23: qty 56

## 2023-02-23 MED ORDER — SIMETHICONE 80 MG PO CHEW: 80.0000 mg | CHEWABLE_TABLET | ORAL | Status: DC | PRN

## 2023-02-23 MED ORDER — FERROUS SULFATE 325 (65 FE) MG PO TABS
325.0000 mg | ORAL_TABLET | Freq: Every day | ORAL | Status: DC
  Administered 2023-02-24: 325 mg via ORAL
  Filled 2023-02-23: qty 1

## 2023-02-23 NOTE — Progress Notes (Signed)
Patient ID: Kieryn Burtis, female   DOB: 14-Nov-1995, 27 y.o.   MRN: [id number]  Comfortable w epidural; Pitocin started at 0600; itching s/p Benadryl; PCN x 2 doses; consented/signed for postplacental IUD including a bit higher likelihood of expulsion  BPs 103/63, 110/74 FHR 130s, +accels, no decels, Cat 1 Ctx q 2-3 mins with Pit at 56mu/min Cx deferred  IUP@39 .5wks Latent labor GBS+  -Will order Vistaril for itching -Plan to check cx in next 1-2hrs or sooner prn -Anticipate vag delivery and postplacental Mirena placement  Arabella Merles [facility] 02/23/2023 7:58 AM

## 2023-02-23 NOTE — Anesthesia Preprocedure Evaluation (Signed)
Anesthesia Evaluation  Patient identified by MRN, date of birth, ID band Patient awake    Reviewed: Allergy & Precautions, NPO status , Patient's Chart, lab work & pertinent test results  History of Anesthesia Complications Negative for: history of anesthetic complications  Airway Mallampati: II   Neck ROM: Full    Dental   Pulmonary neg pulmonary ROS   Pulmonary exam normal        Cardiovascular negative cardio ROS Normal cardiovascular exam     Neuro/Psych negative neurological ROS  negative psych ROS   GI/Hepatic negative GI ROS, Neg liver ROS,,,  Endo/Other   Obesity   Renal/GU negative Renal ROS     Musculoskeletal negative musculoskeletal ROS (+)    Abdominal   Peds  Hematology  (+) Blood dyscrasia, anemia  Plt 188k    Anesthesia Other Findings   Reproductive/Obstetrics (+) Pregnancy                             Anesthesia Physical Anesthesia Plan  ASA: 2  Anesthesia Plan: Epidural   Post-op Pain Management:    Induction:   PONV Risk Score and Plan: 2 and Treatment may vary due to age or medical condition  Airway Management Planned: Natural Airway  Additional Equipment: None  Intra-op Plan:   Post-operative Plan:   Informed Consent: I have reviewed the patients History and Physical, chart, labs and discussed the procedure including the risks, benefits and alternatives for the proposed anesthesia with the patient or authorized representative who has indicated his/her understanding and acceptance.       Plan Discussed with: Anesthesiologist  Anesthesia Plan Comments: (Labs reviewed. Platelets acceptable, patient not taking any blood thinning medications. Per RN, FHR tracing reported to be stable enough for sitting procedure. Risks and benefits discussed with patient, including PDPH, backache, epidural hematoma, failed epidural, blood pressure changes, allergic  reaction, and nerve injury. Patient expressed understanding and wished to proceed.)       Anesthesia Quick Evaluation

## 2023-02-23 NOTE — Anesthesia Postprocedure Evaluation (Signed)
Anesthesia Post Note  Patient: Lindsay Whitney  Procedure(s) Performed: AN AD HOC LABOR EPIDURAL     Patient location during evaluation: Mother Baby Anesthesia Type: Epidural Level of consciousness: awake and alert Pain management: pain level controlled Vital Signs Assessment: post-procedure vital signs reviewed and stable Respiratory status: spontaneous breathing, nonlabored ventilation and respiratory function stable Cardiovascular status: stable Postop Assessment: no headache, no backache and epidural receding Anesthetic complications: no   No notable events documented.  Last Vitals:  Vitals:   02/23/23 1234 02/23/23 1345  BP: 111/61 117/84  Pulse: 80   Resp: 15 16  Temp: 36.9 C 37.2 C  SpO2: 100% 99%    Last Pain:  Vitals:   02/23/23 1519  TempSrc:   PainSc: 0-No pain   Pain Goal:                   Malaak Stach

## 2023-02-23 NOTE — Progress Notes (Signed)
Patient ID: Lindsay Whitney, female   DOB: 05/23/1996, 27 y.o.   MRN: [id number]  Willette Mudry MRN: [id number]  Subjective: -Care assumed of 27 y.o. G5P3013 at [redacted]w[redacted]d who presents for active. In room to meet acquaintance of patient and family.  Patient reports comfort with epidural and denies rectal pressure.   Objective: BP 112/66   Pulse 82   Temp 97.8 F (36.6 C) (Oral)   Resp 16   Ht  (1.549 m)   Wt 73 kg   LMP 04/21/2022   SpO2 100%   BMI 30.40 kg/m  I/O last 3 completed shifts: In: -  Out: 400 [Urine:400] No intake/output data recorded.  Fetal Monitoring: FHT: 135 bpm, Mod Var, -Decels, +Accels UC: Q2-2min, palpates mild to moderate    Physical Exam: General appearance: alert, well appearing, and in no distress. Chest: normal rate and regular rhythm.  clear to auscultation, no wheezes, rales or rhonchi, symmetric air entry. Abdominal exam: Gravid. Extremities: No  Skin exam: Warm Dry  Vaginal Exam: SVE:   Dilation: 5 Effacement (%): 50 Station: -2 Exam by:: Dr. Mervyn Skeeters Membranes: AROM at 0345 Internal Monitors: None  Augmentation/Induction: Pitocin:59mUn/min Cytotec: None  Assessment:  IUP at 39.5 weeks Cat I FT  Labor Augmentation GBS Positive  Plan: -Discussed continued augmentation. -Plan for vaginal exam with c/o of pressure or as appropriate. -Continue other mgmt as ordered. -Anticipate SVD.   Joyce Copa Prabhav Faulkenberry,MSN, CNM 02/23/2023, 8:57 AM

## 2023-02-23 NOTE — Lactation Note (Signed)
This note was copied from a baby's chart. Lactation Consultation Note  Patient Name: Lindsay Whitney [id number] Date: 02/23/2023 Age:29 hours Reason for consult: Initial assessment;Term  Per Birth Parent, she feels breastfeeding is going well, LC did not observe latch due to infant recently BF for 10 minutes per [employer]. Infant had one void diaper since birth. Birth Parent is experienced with breastfeeding see maternal data below. Birth Parent will continue to BF infant by cues, on demand, 8 to 12+ times within 24 hours, STS. LC discussed importance of maternal rest, diet and hydration. Discussed infant's input and output. Birth Parent knows to call RN/LC if their are any BF questions, concerns or if latch assistance is needed. Birth Parent was made aware of O/P services, breastfeeding support groups, community resources, and our phone # for post-discharge questions.    Maternal Data Has patient been taught Hand Expression?: Yes Does the patient have breastfeeding experience prior to this delivery?: Yes How long did the patient breastfeed?: Per Birth Parent, she BF her 3rd child for 6 months who is currently 47 years old.  Feeding Mother's Current Feeding Choice: Breast Milk  LATCH Score  LC did not observe latch at this time. See flow sheet.                  Lactation Tools Discussed/Used    Interventions Interventions: Breast feeding basics reviewed;Skin to skin;Position options;Hand express;Education;LC Services brochure (LC used breast model and Birth Parent did not self express at this time, teaching only.)  Discharge Pump: DEBP;Hands Free;Personal (Per Birth Parent, she has Willow and another DEBP at home.)  Consult Status Consult Status: Follow-up Date: 02/24/23 Follow-up type: In-patient    Frederico Hamman 02/23/2023, 4:19 PM

## 2023-02-23 NOTE — Procedures (Signed)
    Post-Placental IUD Insertion Procedure Note  Patient identified, informed consent signed prior to delivery, signed copy in chart, time out was performed.    Vaginal, labial and perineal areas thoroughly inspected for lacerations and none identified.  Mirena IUD desired and prepared.   - IUD grasped between sterile gloved fingers. Fundus identified through abdominal wall using non-insertion hand. IUD inserted to fundus with bimanual technique. IUD carefully released at the fundus and insertion hand gently removed from vagina.    Strings trimmed to the level of the introitus. Patient tolerated procedure well.  Lot # [id number] Expiration Date: 05/06/2025  Patient given post procedure instructions and IUD care card with expiration date.  Patient is asked to keep IUD strings tucked in her vagina until her postpartum follow up visit in 4-6 weeks. Patient advised to abstain from sexual intercourse and pulling on strings before her follow-up visit. Patient verbalized an understanding of the plan of care and agrees.    Cherre Robins MSN, CNM Advanced Practice Provider, [facility] [employer]

## 2023-02-23 NOTE — Discharge Summary (Signed)
Postpartum Discharge Summary    Patient Name: Lindsay Whitney DOB: [birth year] MRN: [id number]  Date of admission: 02/22/2023 Delivery date:02/23/2023  Delivering provider: Gerrit Heck  Date of discharge: 02/24/2023  Admitting diagnosis: Pregnancy [Z34.90] Intrauterine pregnancy: [redacted]w[redacted]d     Secondary diagnosis:  Principal Problem:   Vaginal delivery Active Problems:   Group B Streptococcus carrier, +RV culture, currently pregnant   Pregnancy   Encounter for initial prescription of intrauterine contraceptive device (IUD)  Additional problems: n/a    Discharge diagnosis: Term Pregnancy Delivered and Anemia                                              Post partum procedures: n/a Augmentation: AROM and Pitocin Complications: None  Hospital course: Onset of Labor With Vaginal Delivery      27 y.o. yo [id number] at [redacted]w[redacted]d was admitted in Active Labor at 4cm with regular contractions on 02/22/2023. Labor course was protracted and therefore augmented with AROM and pitocin. Patient progressed to complete and delivered without incident.  Membrane Rupture Time/Date: 3:56 AM ,02/23/2023   Delivery Method:Vaginal, Spontaneous  Episiotomy: None  Lacerations:  None  Patient had a postpartum course complicated by  pain not well controlled, so changes were made to her pain regimen.  She is ambulating, tolerating a regular diet, passing flatus, and urinating well. Patient is discharged home in stable condition on 02/24/23.  Newborn Data: Birth date:02/23/2023  Birth time:10:43 AM  Gender:Female -Ascencion Dike Living status:Living  Apgars:9 ,9  Weight:3320 g   Magnesium Sulfate received: No BMZ received: No Rhophylac:N/A MMR:N/A T-DaP: declined Flu: No Transfusion:No  Physical exam  Vitals:   02/23/23 1805 02/23/23 2153 02/24/23 0131 02/24/23 0438  BP: 115/83 106/69 116/70 90/60  Pulse: 80 95 78 66  Resp: Temp: 97.9 F (36.6 C) 97.6 F (36.4 C)    TempSrc: Axillary Oral     SpO2: 100% 99%    Weight:      Height:       General: alert, cooperative, and no distress Lochia: appropriate Uterine Fundus: firm Incision: N/A DVT Evaluation: No evidence of DVT seen on physical exam. Labs: Lab Results  Component Value Date   WBC 8.9 02/22/2023   HGB 10.5 (L) 02/22/2023   HCT 31.7 (L) 02/22/2023   MCV 86.4 02/22/2023   PLT 188 02/22/2023      Latest Ref Rng & Units 02/08/2023   12:49 AM  CMP  Glucose 70 - 99 mg/dL 962   BUN 6 - 20 mg/dL 9   Creatinine 9.52 - 8.41 mg/dL 3.24   Sodium 401 - 027 mmol/L 136   Potassium 3.5 - 5.1 mmol/L 3.3   Chloride 98 - 111 mmol/L 106   CO2 22 - 32 mmol/L 21   Calcium 8.9 - 10.3 mg/dL 8.4   Total Protein 6.5 - 8.1 g/dL 6.3   Total Bilirubin 0.3 - 1.2 mg/dL 0.8   Alkaline Phos 38 - 126 U/L 125   AST 15 - 41 U/L 17   ALT 0 - 44 U/L 13    Edinburgh Score:    02/23/2023    9:55 PM  Edinburgh Postnatal Depression Scale Screening Tool  I have been able to laugh and see the funny side of things. 0  I have looked forward with enjoyment to things. 0  I have blamed myself  unnecessarily when things went wrong. 0  I have been anxious or worried for no good reason. 0  I have felt scared or panicky for no good reason. 0  Things have been getting on top of me. 0  I have been so unhappy that I have had difficulty sleeping. 0  I have felt sad or miserable. 0  I have been so unhappy that I have been crying. 0  The thought of harming myself has occurred to me. 0  Edinburgh Postnatal Depression Scale Total 0     After visit meds:  Allergies as of 02/24/2023   No Known Allergies      Medication List     STOP taking these medications    ACCRUFeR 30 MG Caps Generic drug: Ferric Maltol       TAKE these medications    acetaminophen 325 MG tablet Commonly known as: Tylenol Take 2 tablets (650 mg total) by mouth every 4 (four) hours.   benzocaine-Menthol 20-0.5 % Aero Commonly known as: DERMOPLAST Apply 1  Application topically as needed for irritation (perineal discomfort).   [employer] PRENATAL GUMMY PO Take 2 tablets by mouth daily.   ferrous sulfate 325 (65 FE) MG tablet Take 1 tablet (325 mg total) by mouth daily with breakfast.   HYDROcodone-acetaminophen 5-325 MG tablet Commonly known as: Norco Take 1 tablet by mouth every 4 (four) hours as needed for up to 5 days for moderate pain.   ibuprofen 600 MG tablet Commonly known as: ADVIL Take 1 tablet (600 mg total) by mouth every 6 (six) hours.   senna-docusate 8.6-50 MG tablet Commonly known as: Senokot-S Take 2 tablets by mouth daily.         Discharge home in stable condition Infant Feeding: Bottle and Breast Infant Disposition:home with mother Discharge instruction: per After Visit Summary and Postpartum booklet. Activity: Advance as tolerated. Pelvic rest for 6 weeks.  Diet: routine diet Future Appointments: Future Appointments  Date Time Provider Department Center  02/24/2023  8:55 AM Brock Bad, MD [facility] None   Follow up Visit: Message Sent 02/23/2023   Please schedule this patient for a In person postpartum visit in 6 weeks with the following provider: Any provider. Additional Postpartum F/U: None   Low risk pregnancy complicated by: anemia Delivery mode:  Vaginal, Spontaneous  Anticipated Birth Control:  PP IUD placed-Mirena   02/24/2023 Myrtie Hawk, DO

## 2023-02-23 NOTE — Anesthesia Procedure Notes (Signed)
Epidural Patient location during procedure: OB Start time: 02/23/2023 12:12 AM End time: 02/23/2023 12:15 AM  Staffing Anesthesiologist: Beryle Lathe, MD Performed: anesthesiologist   Preanesthetic Checklist Completed: patient identified, IV checked, risks and benefits discussed, monitors and equipment checked, pre-op evaluation and timeout performed  Epidural Patient position: sitting Prep: DuraPrep Patient monitoring: continuous pulse ox and blood pressure Approach: midline Location: L2-L3 Injection technique: LOR saline  Needle:  Needle type: Tuohy  Needle gauge: 17 G Needle length: 9 cm Needle insertion depth: 5 cm Catheter size: 19 Gauge Catheter at skin depth: 10 cm Test dose: negative and Other (1% lidocaine)  Assessment Events: blood not aspirated and no cerebrospinal fluid  Additional Notes Patient identified. Risks including, but not limited to, bleeding, infection, nerve damage, paralysis, inadequate analgesia, blood pressure changes, nausea, vomiting, allergic reaction, postpartum back pain, itching, and headache were discussed. Patient expressed understanding and wished to proceed. Sterile prep and drape, including hand hygiene, mask, and sterile gloves were used. The patient was positioned and the spine was prepped. The skin was anesthetized with lidocaine. No paraesthesia or other complication noted. The patient did not experience any signs of intravascular injection such as tinnitus or metallic taste in mouth, nor signs of intrathecal spread such as rapid motor block. Please see nursing notes for vital signs. The patient tolerated the procedure well.   Leslye Peer, MDReason for block:procedure for pain

## 2023-02-23 NOTE — Progress Notes (Signed)
Labor Progress Note  Lindsay Whitney is a 27 y.o. [id number] at [redacted]w[redacted]d presented for SOL  S: Patient tolerated epidural well. States not pain and feeling well. Has received 4 hours of PCN and therefore discussed AROM which patient is agreeable to.   O:  BP 104/65   Pulse 80   Temp 98.4 F (36.9 C) (Oral)   Resp 17   Ht  (1.549 m)   Wt 73 kg   LMP 04/21/2022   SpO2 100%   BMI 30.40 kg/m  EFM: 125 bpm/Moderate variability/ 15x15 accels/ None decels  CVE: Dilation: 5 Effacement (%): 50 Cervical Position: Posterior Station: -2 Presentation: Vertex Exam by:: Dr. Mervyn Skeeters   A&P: 27 y.o. [id number] [redacted]w[redacted]d  here for SOL as above  #Labor: Progressing appropriately. AROM (0356) performed 4 hours after PCN given. Clear fluid. Will continue to monitor and consider starting pitocin at next check or sooner depending on change #Pain:  Epidural desired  #FWB: Cat 1 #ID: GBS+ --> PCN #MOF: Breast #MOC: IUD PP #Circ: n/a female   #Carrier of SMA, partner negative -Monitor in outpatient    #Anemia On PO Iron at home.  -Hgb 10.5 on admission. Will continue to monitor  NIKE PGY-3 02/23/23  4:08 AM

## 2023-02-24 ENCOUNTER — Other Ambulatory Visit (HOSPITAL_COMMUNITY): Payer: Self-pay

## 2023-02-24 ENCOUNTER — Encounter: Payer: Medicaid Other | Admitting: Obstetrics

## 2023-02-24 MED ORDER — BENZOCAINE-MENTHOL 20-0.5 % EX AERO
1.0000 | INHALATION_SPRAY | CUTANEOUS | 0 refills | Status: AC | PRN
  Filled 2023-02-24: qty 78, fill #0

## 2023-02-24 MED ORDER — FERROUS SULFATE 325 (65 FE) MG PO TABS
325.0000 mg | ORAL_TABLET | Freq: Every day | ORAL | 3 refills | Status: AC
  Filled 2023-02-24: qty 30, 30d supply, fill #0

## 2023-02-24 MED ORDER — ACETAMINOPHEN 325 MG PO TABS
650.0000 mg | ORAL_TABLET | ORAL | Status: DC
  Administered 2023-02-24: 650 mg via ORAL
  Filled 2023-02-24: qty 2

## 2023-02-24 MED ORDER — IBUPROFEN 600 MG PO TABS
600.0000 mg | ORAL_TABLET | Freq: Four times a day (QID) | ORAL | 0 refills | Status: AC
  Filled 2023-02-24: qty 30, 8d supply, fill #0

## 2023-02-24 MED ORDER — SENNOSIDES-DOCUSATE SODIUM 8.6-50 MG PO TABS
2.0000 | ORAL_TABLET | ORAL | 0 refills | Status: AC
  Filled 2023-02-24: qty 30, 15d supply, fill #0

## 2023-02-24 MED ORDER — ACETAMINOPHEN 325 MG PO TABS
650.0000 mg | ORAL_TABLET | ORAL | 0 refills | Status: AC
  Filled 2023-02-24: qty 30, 3d supply, fill #0

## 2023-02-24 MED ORDER — HYDROCODONE-ACETAMINOPHEN 5-325 MG PO TABS
1.0000 | ORAL_TABLET | ORAL | 0 refills | Status: AC | PRN
  Filled 2023-02-24: qty 6, 1d supply, fill #0

## 2023-02-24 NOTE — Lactation Note (Signed)
This note was copied from a baby's chart. Lactation Consultation Note  Patient Name: Lindsay Whitney [id number] Date: 02/24/2023 Age:29 hours Reason for consult: Follow-up assessment  Reviewed engorgement care and monitoring voids/stools. Baby recently breastfed for 10 min.  No questions at this time.   Feeding Mother's Current Feeding Choice: Breast Milk  Interventions    Discharge Discharge Education: Engorgement and breast care;Warning signs for feeding baby  Consult Status Consult Status: Complete Date: 02/24/23    [name] [facility] 02/24/2023, 11:23 AM

## 2023-03-07 ENCOUNTER — Telehealth (HOSPITAL_COMMUNITY): Payer: Self-pay | Admitting: *Deleted

## 2023-03-07 NOTE — Telephone Encounter (Signed)
Left phone voicemail message.  Duffy Rhody, RN 03-07-2023 at 9:37am

## 2023-04-12 ENCOUNTER — Ambulatory Visit: Payer: Medicaid Other | Admitting: Obstetrics and Gynecology

## 2023-04-17 ENCOUNTER — Telehealth: Payer: Self-pay

## 2023-04-17 NOTE — Telephone Encounter (Signed)
called to r/s missed appt

## 2023-07-18 ENCOUNTER — Other Ambulatory Visit: Payer: Self-pay

## 2023-07-18 ENCOUNTER — Emergency Department (HOSPITAL_COMMUNITY)
Admission: EM | Admit: 2023-07-18 | Discharge: 2023-07-18 | Disposition: A | Discharge status: Home / Self Care | Payer: Medicaid Other

## 2023-07-18 ENCOUNTER — Encounter (HOSPITAL_COMMUNITY): Payer: Self-pay

## 2023-07-18 DIAGNOSIS — H9201 Otalgia, right ear: Secondary | ICD-10-CM

## 2023-07-18 DIAGNOSIS — H7292 Unspecified perforation of tympanic membrane, left ear: Secondary | ICD-10-CM

## 2023-07-18 MED ORDER — OFLOXACIN 0.3 % OT SOLN: 5.0000 [drp] | Freq: Two times a day (BID) | OTIC | 0 refills | Status: AC

## 2023-07-18 NOTE — ED Triage Notes (Addendum)
Pt reports with right ear pain since this morning. Pt states that she cannot hear out of that ear.

## 2023-07-18 NOTE — ED Provider Notes (Signed)
[place] EMERGENCY DEPARTMENT AT [facility] Provider Note   CSN: [id number] Arrival date & time: 07/18/23  1938     History  Chief Complaint  Patient presents with   Lindsay Whitney is a 27 y.o. female.  Patient presents with right otalgia and muffled hearing. Recent URI symptoms with cough. Small perforation noted to right TM on exam  The history is provided by the patient.  Otalgia Location:  Right Behind ear:  No abnormality Quality:  Throbbing Severity:  Mild Onset quality:  Sudden Duration:  12 hours Timing:  Constant Progression:  Unchanged Chronicity:  New Context: recent URI   Associated symptoms: congestion, cough, hearing loss, rhinorrhea and sore throat   Associated symptoms: no fever        Home Medications Prior to Admission medications   Medication Sig Start Date End Date Taking? Authorizing Provider  acetaminophen (TYLENOL) 325 MG tablet Take 2 tablets (650 mg total) by mouth every 4 (four) hours. 02/24/23   Mercado-Ortiz, Lahoma Crocker, DO  benzocaine-Menthol (DERMOPLAST) 20-0.5 % AERO Apply 1 Application topically as needed for irritation (perineal discomfort). 02/24/23   Mercado-Ortiz, Lahoma Crocker, DO  ferrous sulfate 325 (65 FE) MG tablet Take 1 tablet (325 mg total) by mouth daily with breakfast. 02/24/23   Mercado-Ortiz, Lahoma Crocker, DO  ibuprofen (ADVIL) 600 MG tablet Take 1 tablet (600 mg total) by mouth every 6 (six) hours. 02/24/23   Mercado-Ortiz, Lahoma Crocker, DO  Prenatal Vit-Min-FA-Fish Oil ([employer] PRENATAL GUMMY PO) Take 2 tablets by mouth daily.    [provider]  senna-docusate (SENOKOT-S) 8.6-50 MG tablet Take 2 tablets by mouth daily. 02/24/23   Mercado-Ortiz, Lahoma Crocker, DO      Allergies    Patient has no known allergies.    Review of Systems   Review of Systems  Constitutional:  Negative for fever.  HENT:  Positive for congestion, ear pain, hearing loss, rhinorrhea and sore throat.   Respiratory:  Positive for  cough.   All other systems reviewed and are negative.   Physical Exam Updated Vital Signs BP (!) 134/95 (BP Location: Right Arm)   Pulse 94   Temp 98.1 F (36.7 C) (Oral)   Resp 18   Ht 5\' 1"  (1.549 m)   Wt 68 kg   SpO2 98%   BMI 28.34 kg/m  Physical Exam Constitutional:      Appearance: Normal appearance.  HENT:     Head: Normocephalic and atraumatic.     Right Ear: Decreased hearing noted. Tympanic membrane is perforated.     Left Ear: Hearing and ear canal normal.     Ears:     Comments: Wax build-up in left ear canal obscuring TM    Nose: Nose normal.     Mouth/Throat:     Mouth: Mucous membranes are moist.  Eyes:     Conjunctiva/sclera: Conjunctivae normal.  Cardiovascular:     Rate and Rhythm: Normal rate and regular rhythm.  Pulmonary:     Effort: Pulmonary effort is normal.     Breath sounds: Normal breath sounds.  Musculoskeletal:        General: Normal range of motion.  Skin:    General: Skin is warm and dry.  Neurological:     Mental Status: She is alert and oriented to person, place, and time.  Psychiatric:        Mood and Affect: Mood normal.        Behavior: Behavior normal.  ED Results / Procedures / Treatments   Labs (all labs ordered are listed, but only abnormal results are displayed) Labs Reviewed - No data to display  EKG None  Radiology No results found.  Procedures Procedures    Medications Ordered in ED Medications - No data to display  ED Course/ Medical Decision Making/ A&P                                 Medical Decision Making Risk Prescription drug management.   Pt presenting with right otalgia with tympanic membrane rupture. Recent URI. Pt afebrile. Discharge with ofloxacin.  Advised follow up with PCP or ENT in 2-3 days if no improvement.  Return precautions discussed. Pt appears safe for discharge.         Final Clinical Impression(s) / ED Diagnoses Final diagnoses:  Otalgia of right ear  Tympanic  membrane perforation, left    Rx / DC Orders ED Discharge Orders          Ordered    ofloxacin (FLOXIN) 0.3 % OTIC solution  2 times daily       Note to Pharmacy: May substitute with ophthalmic formulation if needed   07/18/23 2038              Felicie Morn, NP 07/18/23 2247    Coral Spikes, DO 07/18/23 2332

## 2023-07-18 NOTE — Discharge Instructions (Addendum)
Please refer to the attached instructions. Tylenol or ibuprofen for discomfort. Apply ear drops as directed. Follow-up with primary care or ENT if no improvement.

## 2023-09-07 ENCOUNTER — Institutional Professional Consult (permissible substitution) (INDEPENDENT_AMBULATORY_CARE_PROVIDER_SITE_OTHER): Payer: Medicaid Other
